# Patient Record
Sex: Female | Born: 1980 | Race: White | Hispanic: No | Smoking: Former smoker
Health system: Southern US, Community
[De-identification: ages and names within clinical notes are randomized; demographics above are authoritative.]

## PROBLEM LIST (undated history)

## (undated) DIAGNOSIS — R51 Headache: Secondary | ICD-10-CM

## (undated) DIAGNOSIS — B019 Varicella without complication: Secondary | ICD-10-CM

## (undated) DIAGNOSIS — T7840XA Allergy, unspecified, initial encounter: Secondary | ICD-10-CM

## (undated) DIAGNOSIS — E119 Type 2 diabetes mellitus without complications: Secondary | ICD-10-CM

## (undated) DIAGNOSIS — R519 Headache, unspecified: Secondary | ICD-10-CM

## (undated) DIAGNOSIS — E1140 Type 2 diabetes mellitus with diabetic neuropathy, unspecified: Secondary | ICD-10-CM

## (undated) DIAGNOSIS — K219 Gastro-esophageal reflux disease without esophagitis: Secondary | ICD-10-CM

## (undated) DIAGNOSIS — F419 Anxiety disorder, unspecified: Secondary | ICD-10-CM

## (undated) DIAGNOSIS — E785 Hyperlipidemia, unspecified: Secondary | ICD-10-CM

## (undated) HISTORY — DX: Headache: R51

## (undated) HISTORY — DX: Hyperlipidemia, unspecified: E78.5

## (undated) HISTORY — DX: Headache, unspecified: R51.9

## (undated) HISTORY — DX: Gastro-esophageal reflux disease without esophagitis: K21.9

## (undated) HISTORY — DX: Varicella without complication: B01.9

## (undated) HISTORY — PX: TONSILLECTOMY: SUR1361

## (undated) HISTORY — DX: Allergy, unspecified, initial encounter: T78.40XA

---

## 2005-07-27 ENCOUNTER — Emergency Department: Payer: Self-pay | Admitting: Emergency Medicine

## 2006-08-09 ENCOUNTER — Other Ambulatory Visit: Payer: Self-pay

## 2006-08-09 ENCOUNTER — Emergency Department: Payer: Self-pay | Admitting: Emergency Medicine

## 2006-10-24 ENCOUNTER — Emergency Department: Payer: Self-pay | Admitting: Emergency Medicine

## 2006-10-27 ENCOUNTER — Ambulatory Visit: Payer: Self-pay | Admitting: Emergency Medicine

## 2006-11-02 ENCOUNTER — Ambulatory Visit: Payer: Self-pay | Admitting: Emergency Medicine

## 2006-11-24 ENCOUNTER — Ambulatory Visit (HOSPITAL_COMMUNITY): Admission: RE | Admit: 2006-11-24 | Discharge: 2006-11-24 | Payer: Self-pay | Admitting: Obstetrics and Gynecology

## 2006-12-23 ENCOUNTER — Ambulatory Visit (HOSPITAL_COMMUNITY): Admission: RE | Admit: 2006-12-23 | Discharge: 2006-12-23 | Payer: Self-pay | Admitting: Family Medicine

## 2006-12-28 ENCOUNTER — Ambulatory Visit: Payer: Self-pay | Admitting: Obstetrics & Gynecology

## 2006-12-29 ENCOUNTER — Ambulatory Visit: Payer: Self-pay | Admitting: Obstetrics & Gynecology

## 2006-12-31 ENCOUNTER — Ambulatory Visit: Payer: Self-pay | Admitting: Obstetrics and Gynecology

## 2007-01-04 ENCOUNTER — Ambulatory Visit: Payer: Self-pay | Admitting: Obstetrics & Gynecology

## 2007-01-04 ENCOUNTER — Ambulatory Visit (HOSPITAL_COMMUNITY): Admission: RE | Admit: 2007-01-04 | Discharge: 2007-01-04 | Payer: Self-pay | Admitting: Family Medicine

## 2007-01-11 ENCOUNTER — Ambulatory Visit: Payer: Self-pay | Admitting: Obstetrics & Gynecology

## 2007-01-20 ENCOUNTER — Ambulatory Visit: Payer: Self-pay | Admitting: Obstetrics and Gynecology

## 2007-01-20 ENCOUNTER — Ambulatory Visit (HOSPITAL_COMMUNITY): Admission: RE | Admit: 2007-01-20 | Discharge: 2007-01-20 | Payer: Self-pay | Admitting: Obstetrics and Gynecology

## 2007-01-25 ENCOUNTER — Ambulatory Visit: Payer: Self-pay | Admitting: Obstetrics & Gynecology

## 2007-01-25 ENCOUNTER — Encounter: Admission: RE | Admit: 2007-01-25 | Discharge: 2007-03-23 | Payer: Self-pay | Admitting: Obstetrics & Gynecology

## 2007-02-01 ENCOUNTER — Ambulatory Visit: Payer: Self-pay | Admitting: Obstetrics & Gynecology

## 2007-02-08 ENCOUNTER — Ambulatory Visit: Payer: Self-pay | Admitting: Obstetrics & Gynecology

## 2007-02-17 ENCOUNTER — Ambulatory Visit (HOSPITAL_COMMUNITY): Admission: RE | Admit: 2007-02-17 | Discharge: 2007-02-17 | Payer: Self-pay | Admitting: Family Medicine

## 2007-02-25 ENCOUNTER — Ambulatory Visit: Payer: Self-pay | Admitting: Family Medicine

## 2007-03-01 ENCOUNTER — Ambulatory Visit: Payer: Self-pay | Admitting: Family Medicine

## 2007-03-17 ENCOUNTER — Ambulatory Visit (HOSPITAL_COMMUNITY): Admission: RE | Admit: 2007-03-17 | Discharge: 2007-03-17 | Payer: Self-pay | Admitting: Family Medicine

## 2007-03-18 ENCOUNTER — Ambulatory Visit: Payer: Self-pay | Admitting: Family Medicine

## 2007-03-25 ENCOUNTER — Ambulatory Visit: Payer: Self-pay | Admitting: Obstetrics & Gynecology

## 2007-04-05 ENCOUNTER — Ambulatory Visit: Payer: Self-pay | Admitting: Obstetrics & Gynecology

## 2007-04-12 ENCOUNTER — Ambulatory Visit: Payer: Self-pay | Admitting: Obstetrics & Gynecology

## 2007-04-14 ENCOUNTER — Ambulatory Visit (HOSPITAL_COMMUNITY): Admission: RE | Admit: 2007-04-14 | Discharge: 2007-04-14 | Payer: Self-pay | Admitting: Family Medicine

## 2007-04-19 ENCOUNTER — Ambulatory Visit: Payer: Self-pay | Admitting: Obstetrics & Gynecology

## 2007-04-26 ENCOUNTER — Encounter: Admission: RE | Admit: 2007-04-26 | Discharge: 2007-04-26 | Payer: Self-pay | Admitting: Obstetrics & Gynecology

## 2007-04-26 ENCOUNTER — Ambulatory Visit: Payer: Self-pay | Admitting: Obstetrics & Gynecology

## 2007-05-03 ENCOUNTER — Ambulatory Visit: Payer: Self-pay | Admitting: Obstetrics & Gynecology

## 2007-05-06 ENCOUNTER — Ambulatory Visit: Payer: Self-pay | Admitting: Obstetrics & Gynecology

## 2007-05-10 ENCOUNTER — Ambulatory Visit: Payer: Self-pay | Admitting: Obstetrics & Gynecology

## 2007-05-13 ENCOUNTER — Ambulatory Visit (HOSPITAL_COMMUNITY): Admission: RE | Admit: 2007-05-13 | Discharge: 2007-05-13 | Payer: Self-pay | Admitting: Family Medicine

## 2007-05-17 ENCOUNTER — Ambulatory Visit: Payer: Self-pay | Admitting: Obstetrics & Gynecology

## 2007-05-19 ENCOUNTER — Ambulatory Visit (HOSPITAL_COMMUNITY): Admission: RE | Admit: 2007-05-19 | Discharge: 2007-05-19 | Payer: Self-pay | Admitting: Family Medicine

## 2007-05-19 ENCOUNTER — Ambulatory Visit: Payer: Self-pay | Admitting: Obstetrics and Gynecology

## 2007-05-24 ENCOUNTER — Ambulatory Visit: Payer: Self-pay | Admitting: Obstetrics & Gynecology

## 2007-05-28 ENCOUNTER — Ambulatory Visit (HOSPITAL_COMMUNITY): Admission: RE | Admit: 2007-05-28 | Discharge: 2007-05-28 | Payer: Self-pay | Admitting: Obstetrics & Gynecology

## 2007-05-31 ENCOUNTER — Ambulatory Visit: Payer: Self-pay | Admitting: Obstetrics & Gynecology

## 2007-06-01 ENCOUNTER — Ambulatory Visit: Payer: Self-pay | Admitting: Obstetrics & Gynecology

## 2007-06-03 ENCOUNTER — Ambulatory Visit: Payer: Self-pay | Admitting: Obstetrics & Gynecology

## 2007-06-03 ENCOUNTER — Encounter: Payer: Self-pay | Admitting: *Deleted

## 2007-06-03 ENCOUNTER — Inpatient Hospital Stay (HOSPITAL_COMMUNITY): Admission: AD | Admit: 2007-06-03 | Discharge: 2007-06-08 | Payer: Self-pay | Admitting: Obstetrics and Gynecology

## 2007-06-05 ENCOUNTER — Encounter: Payer: Self-pay | Admitting: Obstetrics & Gynecology

## 2007-06-11 ENCOUNTER — Ambulatory Visit: Payer: Self-pay | Admitting: Obstetrics & Gynecology

## 2008-12-14 IMAGING — US US OB TRANSVAGINAL MODIFY
1 series · 14 of 28 positions shown · non-contrast
Comparison: none

OBSTETRICAL ULTRASOUND:

 This ultrasound exam was performed in the [HOSPITAL] Ultrasound Department.  The OB US report was generated in the AS system, and faxed to the ordering physician.  This report is also available in [REDACTED] PACS.

[Series 1: us ob transvaginal modify · 0.37mm/px · 14 of 33 slices shown]
[im 2/33]
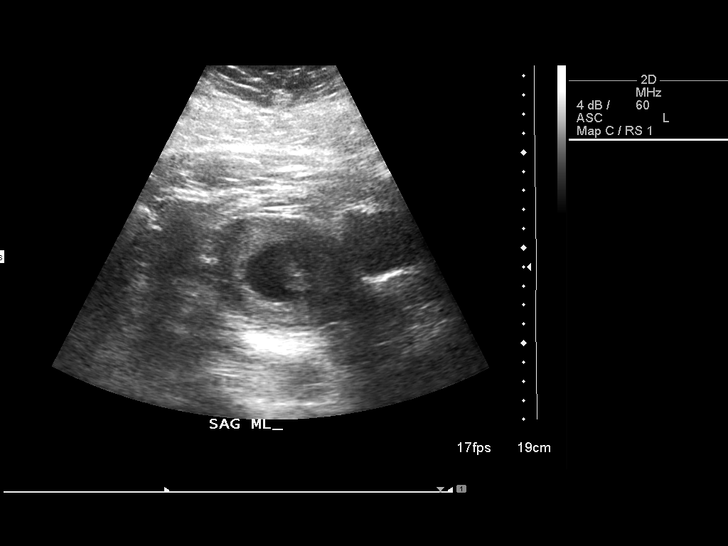
[im 4/33]
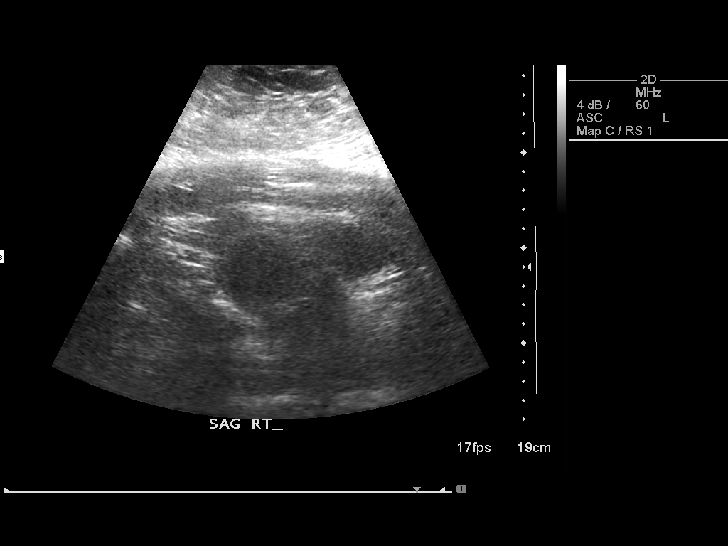
[im 6/33]
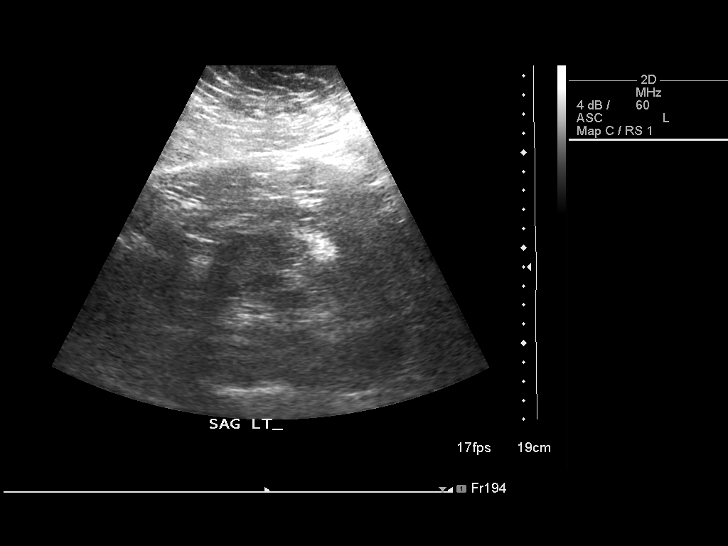
[im 9/33]
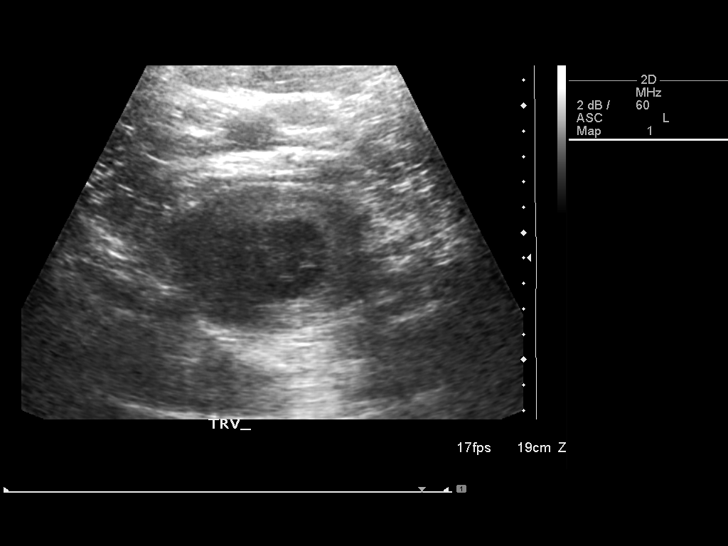
[im 11/33]
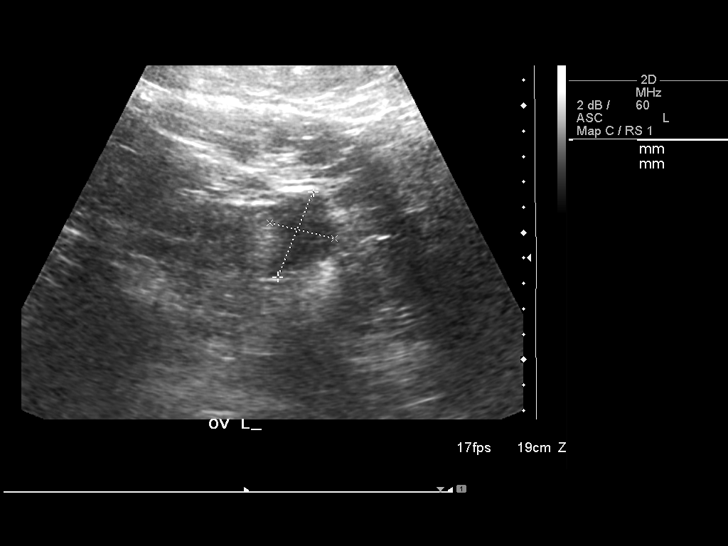
[im 14/33]
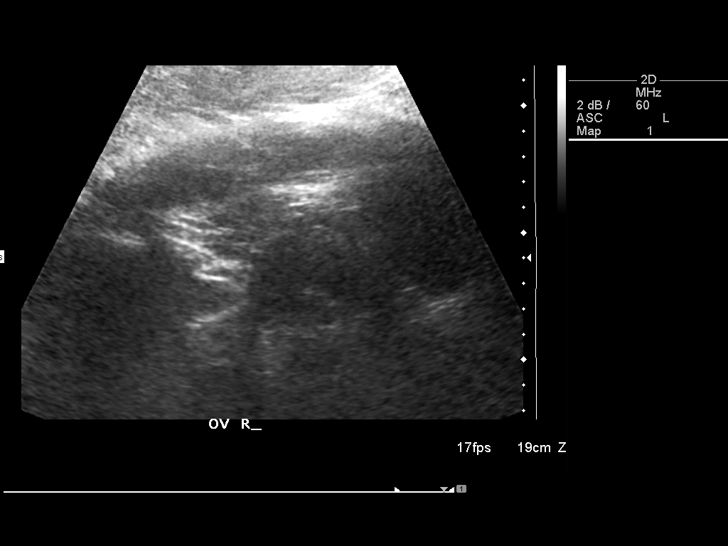
[im 16/33]
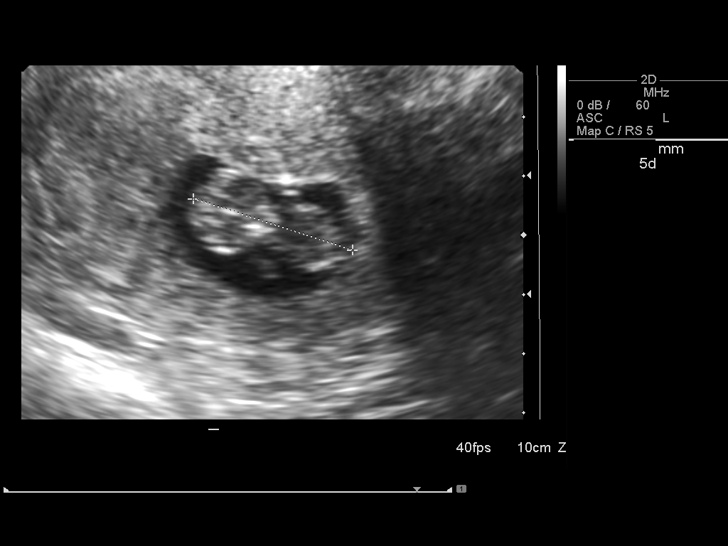
[im 18/33]
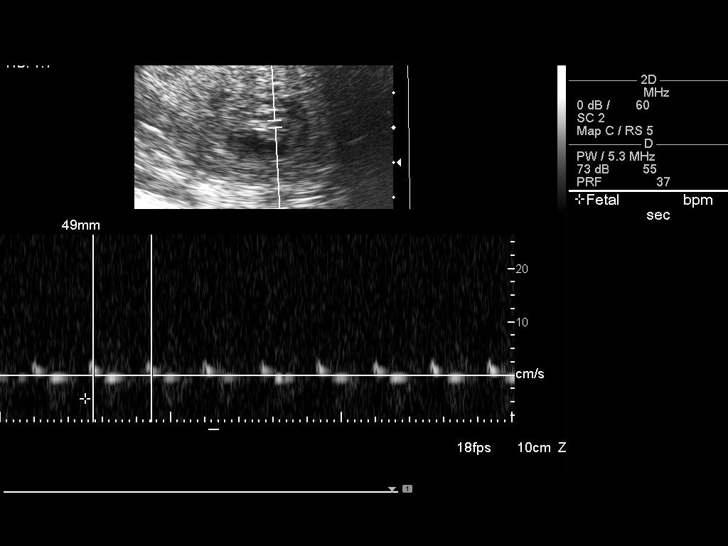
[im 21/33]
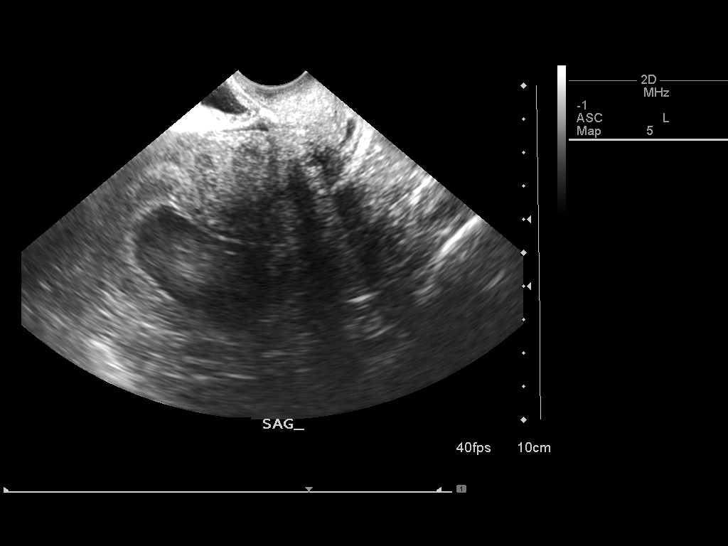
[im 23/33]
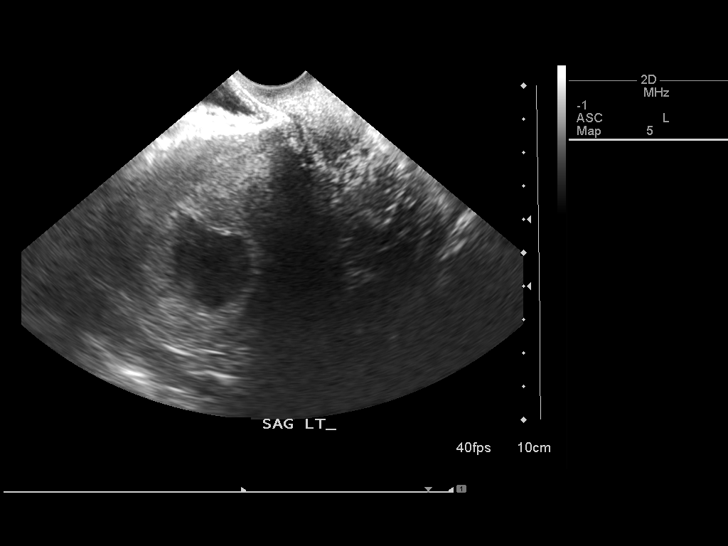
[im 25/33]
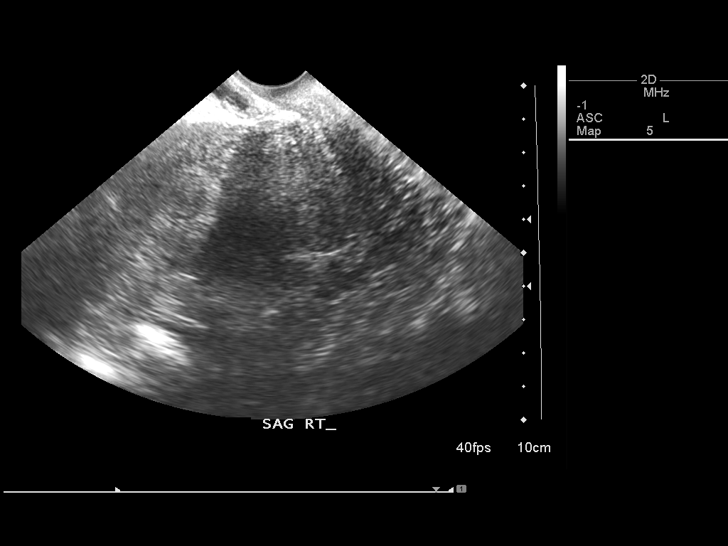
[im 28/33]
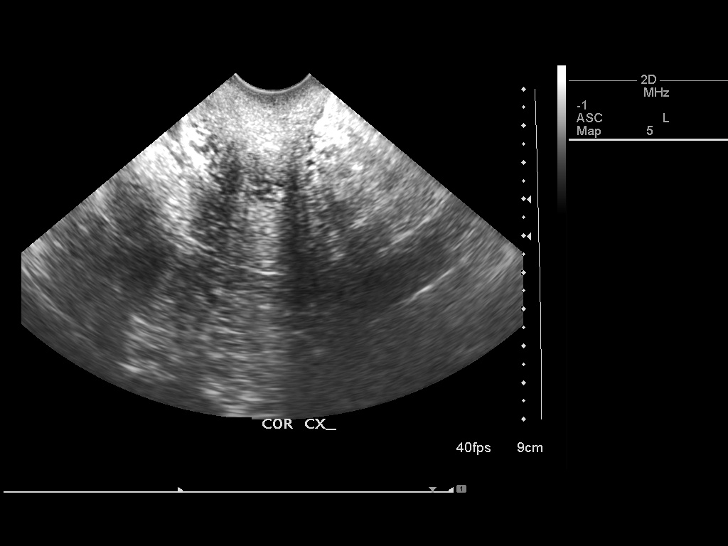
[im 30/33]
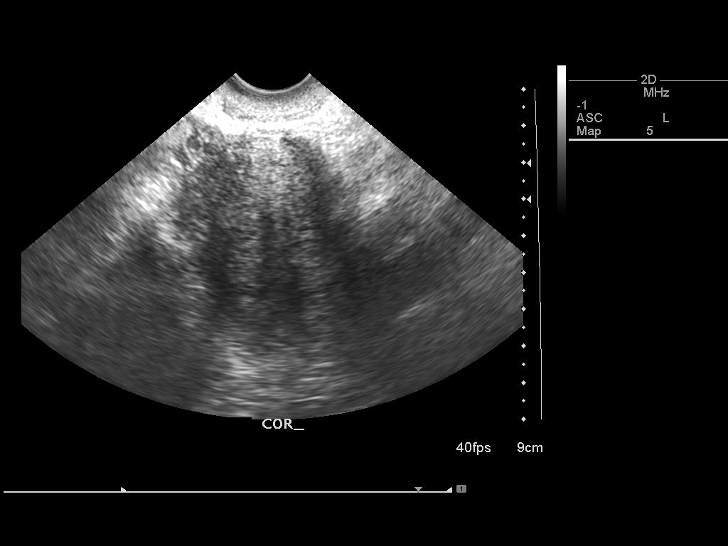
[im 33/33]
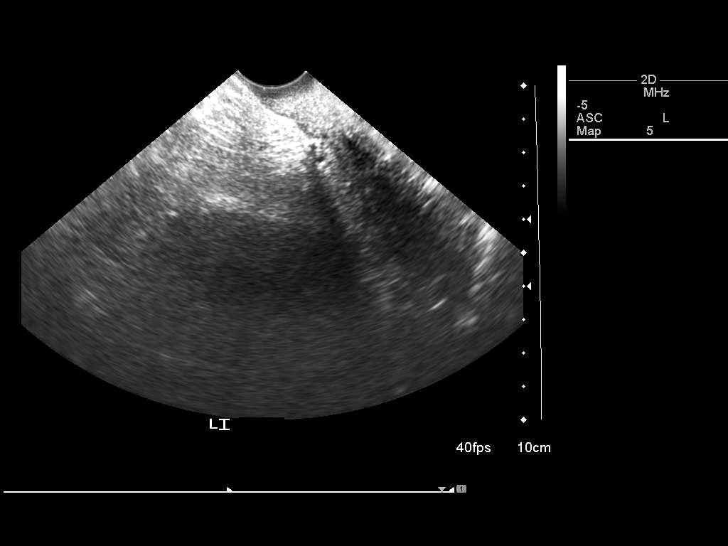

[14 of 28 positions shown; findings below may reference images not displayed]

IMPRESSION: See AS Obstetric US report.

## 2009-01-12 IMAGING — US US OB NUCHAL TRANSLUCENCY 1ST GEST
1 series · 8 of 8 positions shown · non-contrast
Comparison: none

OBSTETRICAL ULTRASOUND:
 This ultrasound was performed in The [HOSPITAL], and the AS OB/GYN report will be stored to [REDACTED] PACS.

[Series 1: us ob nuchal translucency 1st gest · 8 of 8 slices shown]
[im 1/8]
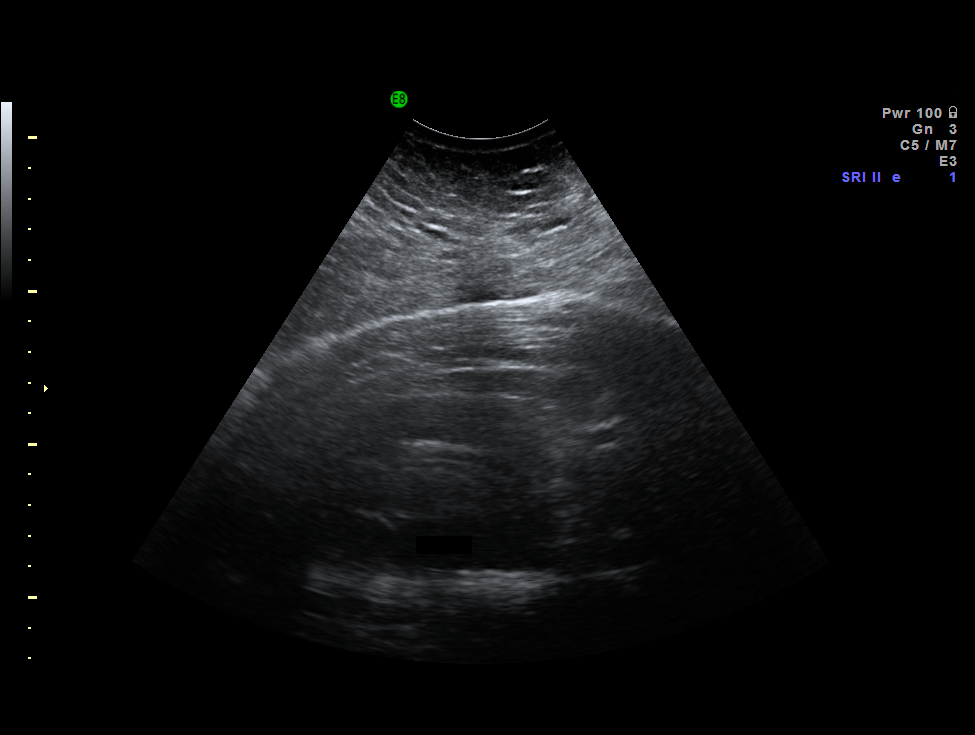
[im 2/8]
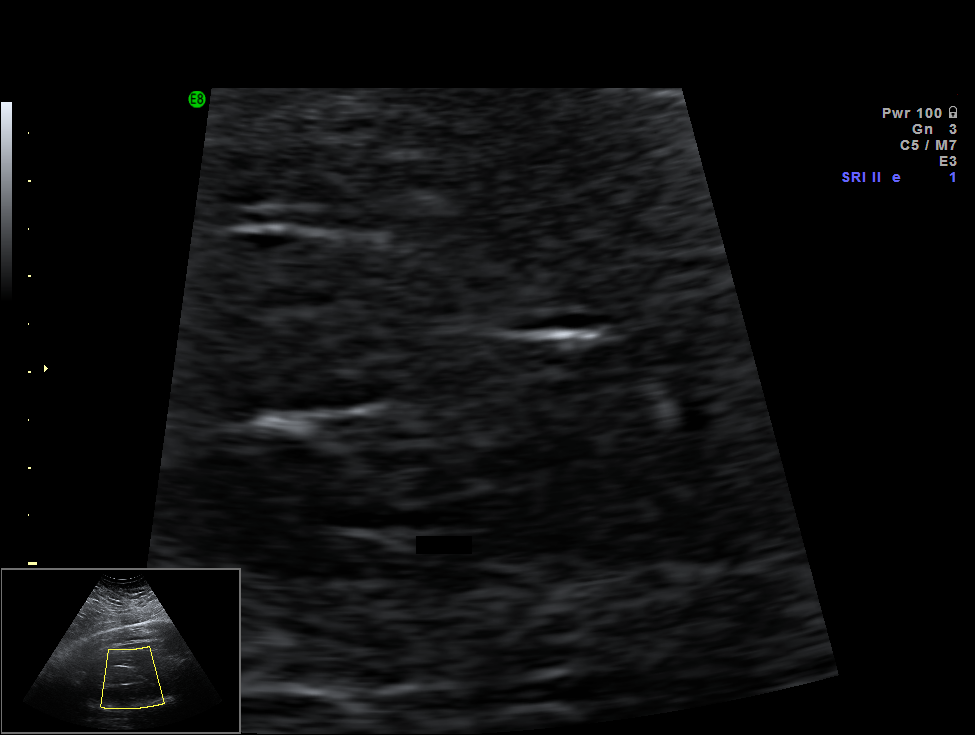
[im 3/8]
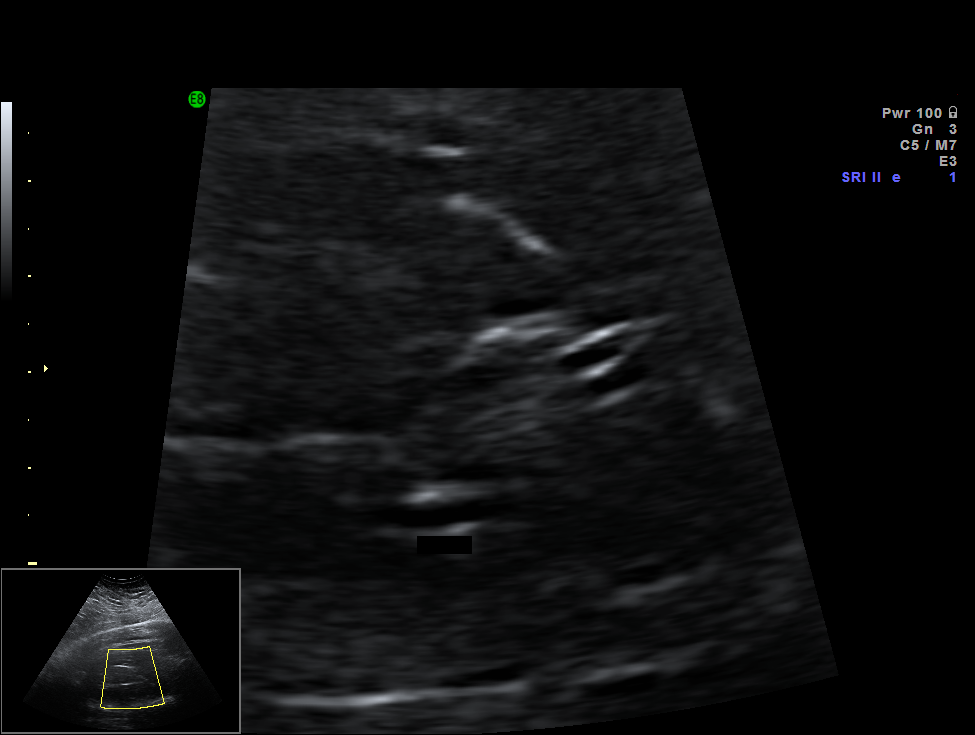
[im 4/8]
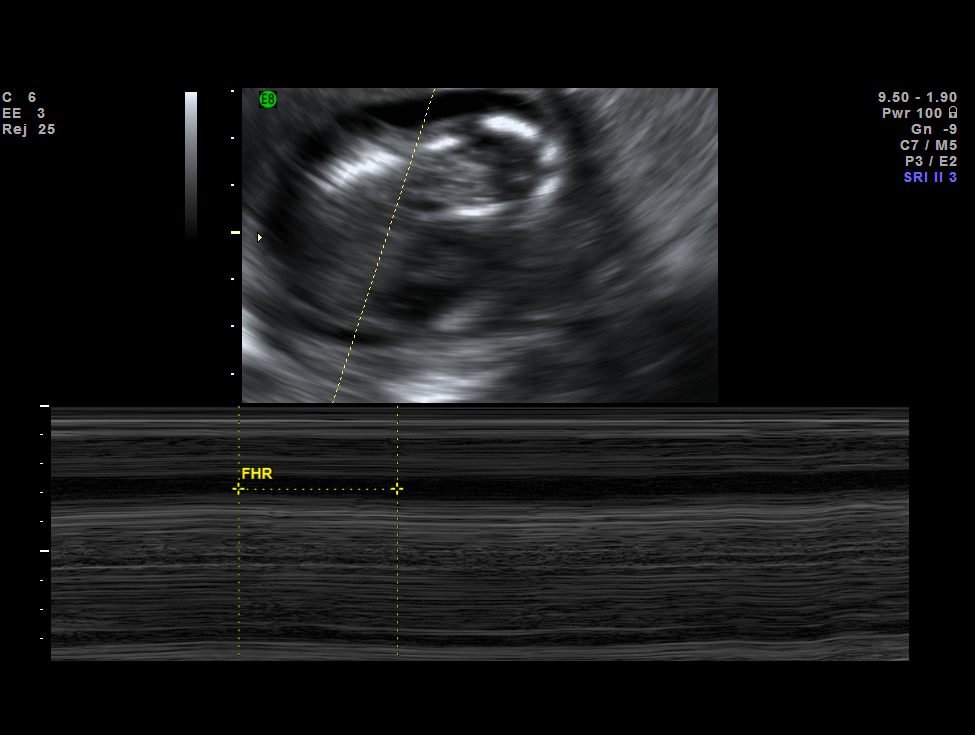
[im 5/8]
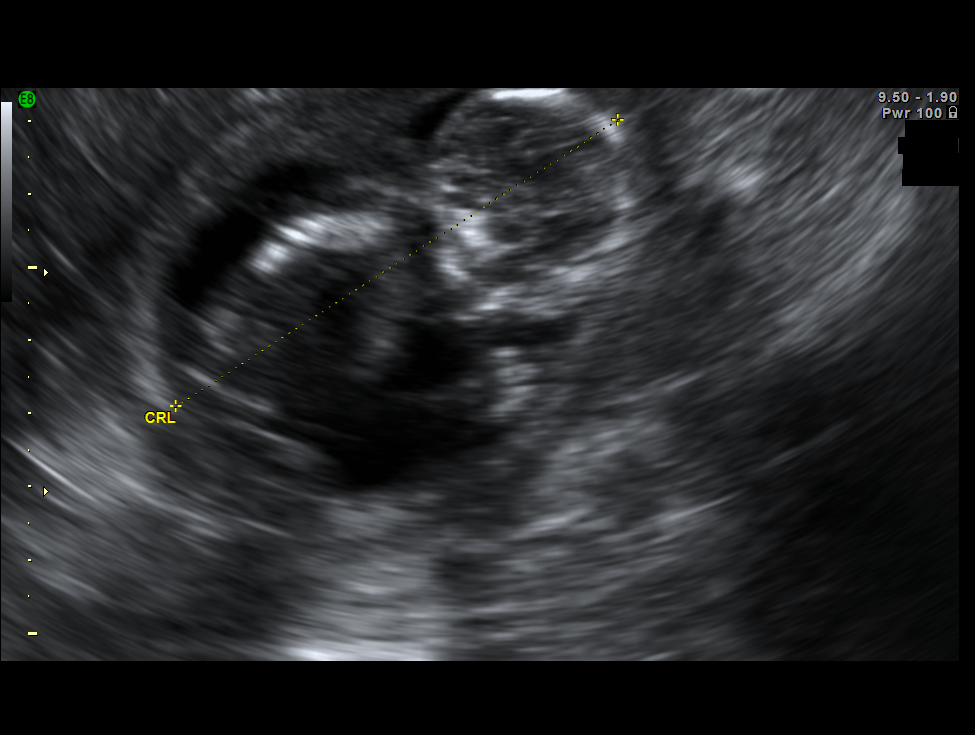
[im 6/8]
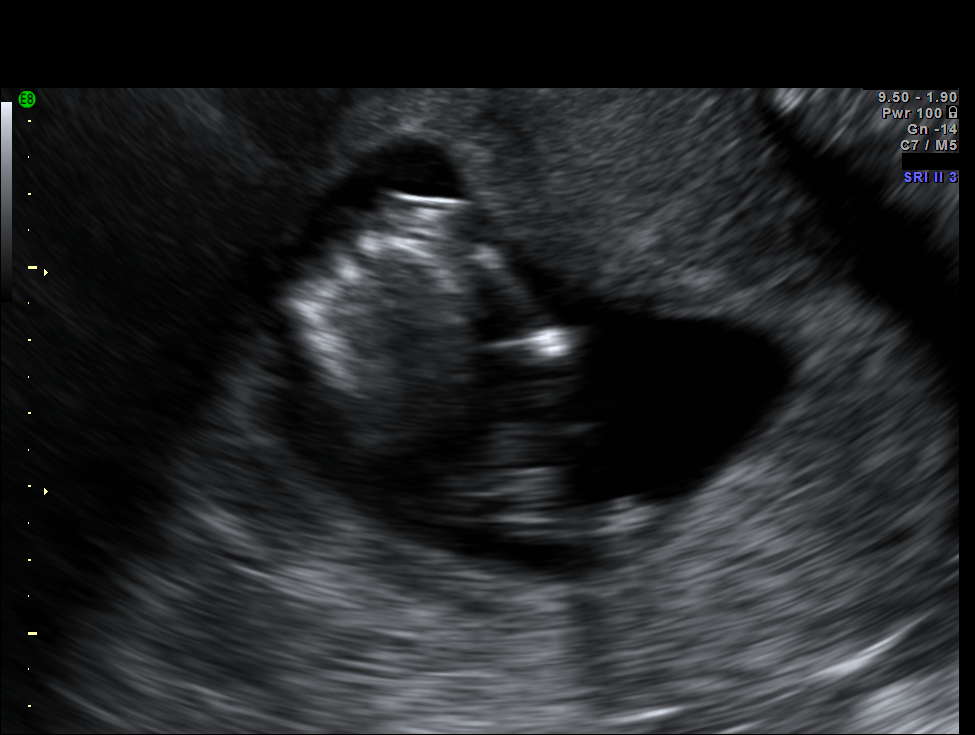
[im 7/8]
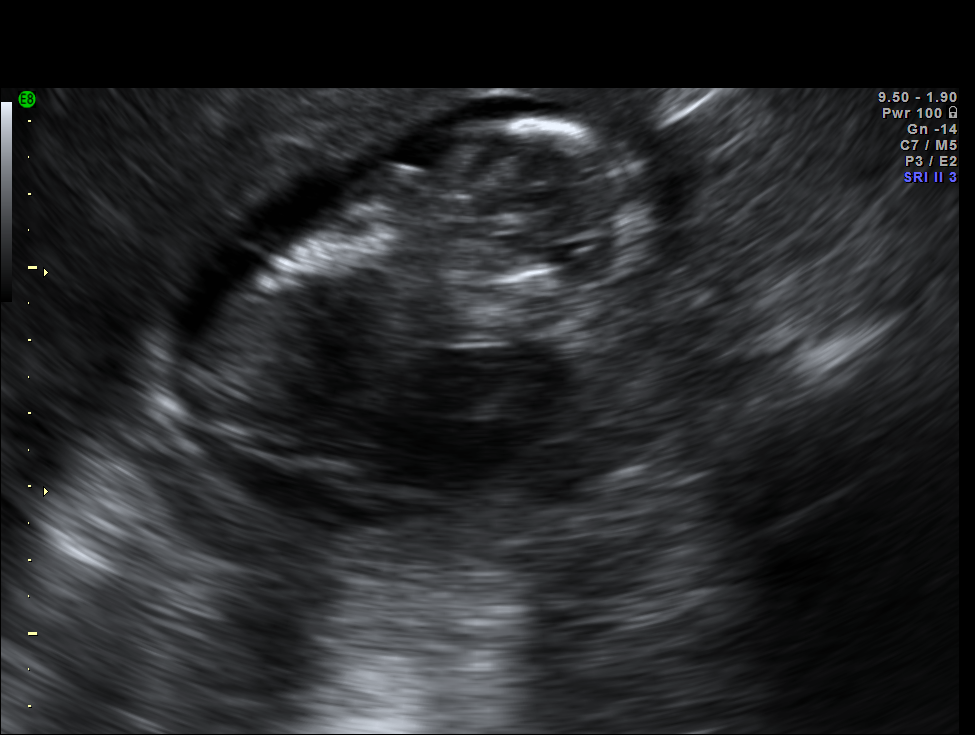
[im 8/8]
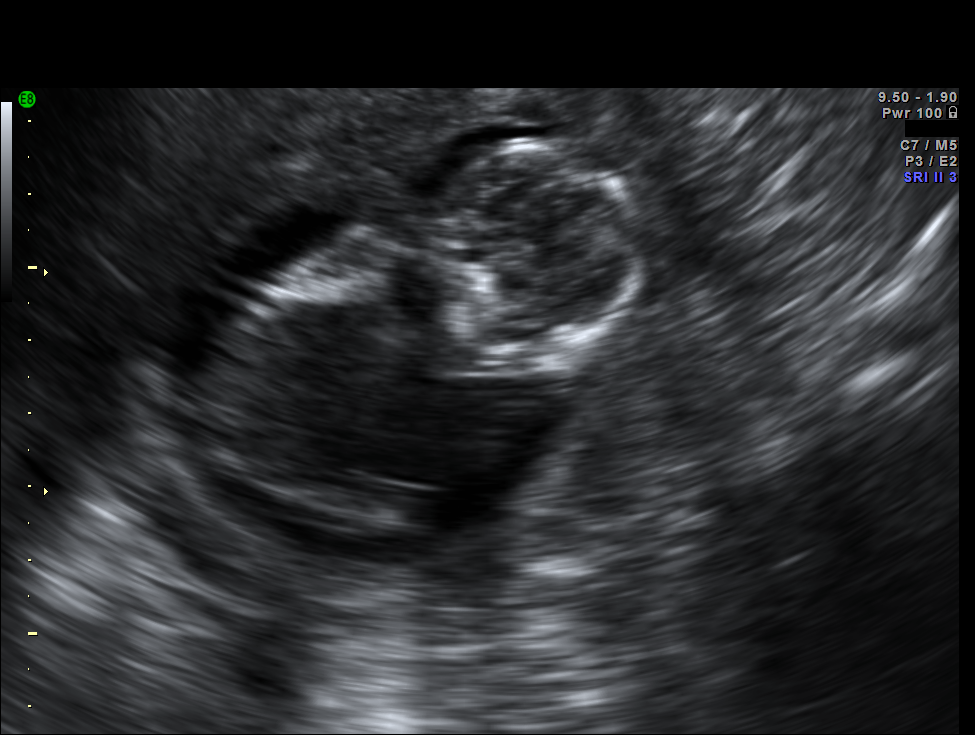

[8 of 8 positions shown; findings below may reference images not displayed]

IMPRESSION: The AS OB/GYN report has also been faxed to the ordering physician.

## 2009-05-04 IMAGING — US US OB FOLLOW-UP
1 series · 14 of 28 positions shown · non-contrast
Comparison: none

OBSTETRICAL ULTRASOUND:
 This ultrasound was performed in The [HOSPITAL], and the AS OB/GYN report will be stored to [REDACTED] PACS.

[Series 1: us ob follow-up · 49 acquisitions, 14 frames shown]
[im 2/49]
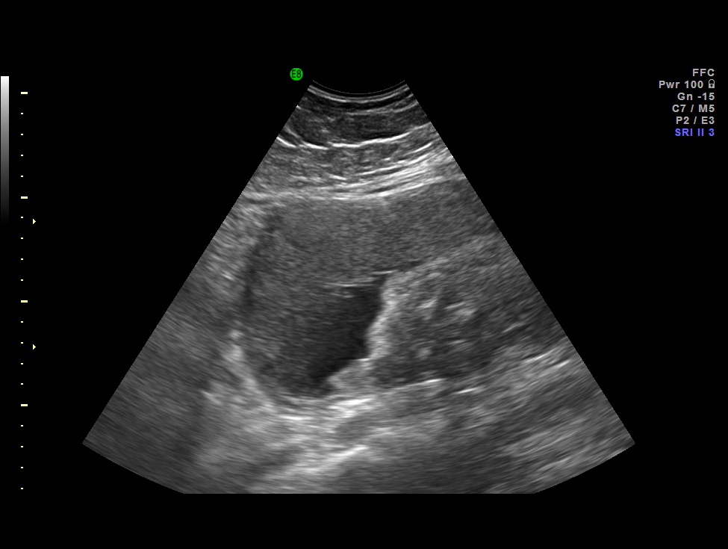
[im 6/49]
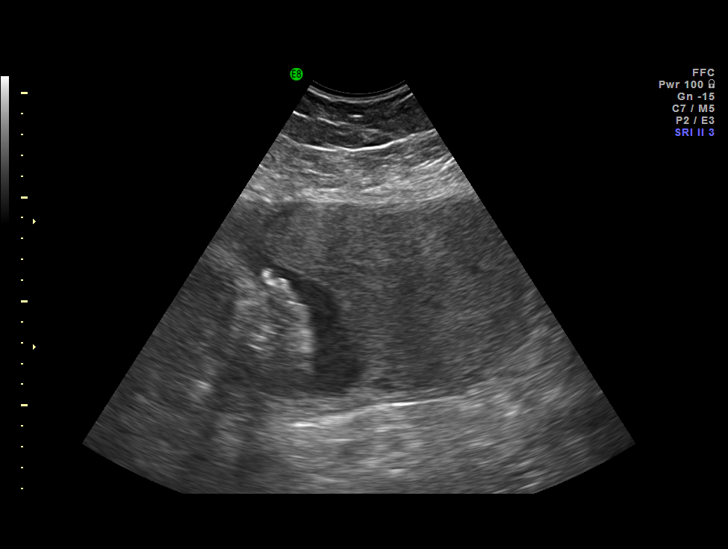
[im 9/49]
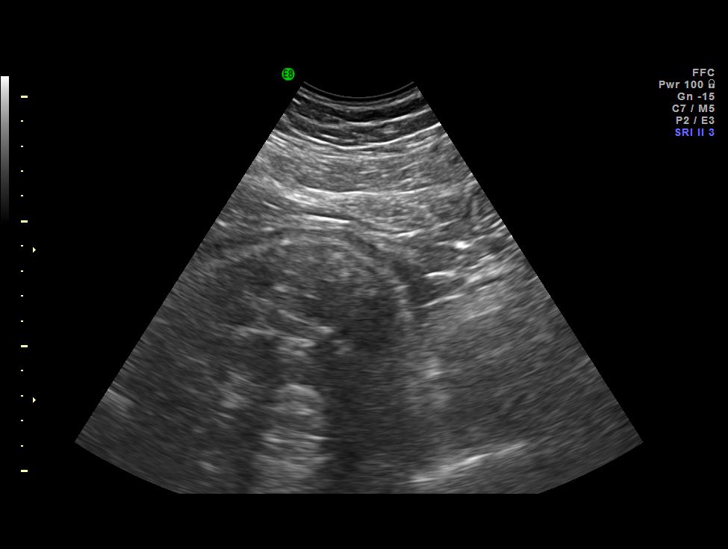
[im 13/49]
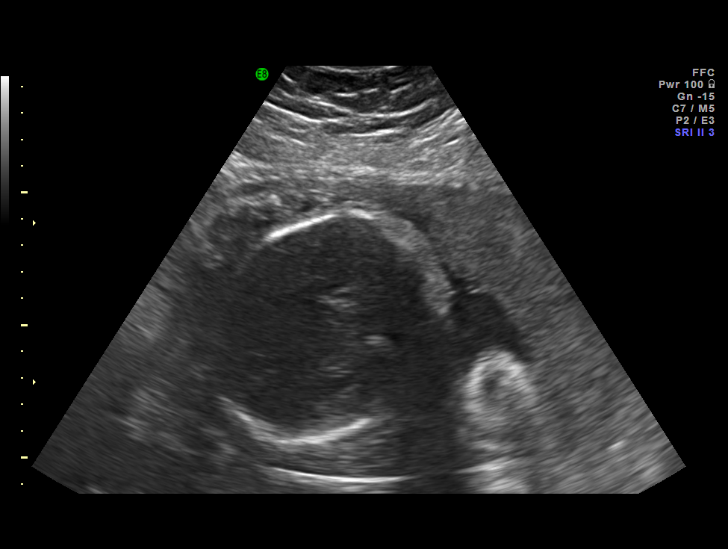
[im 17/49]
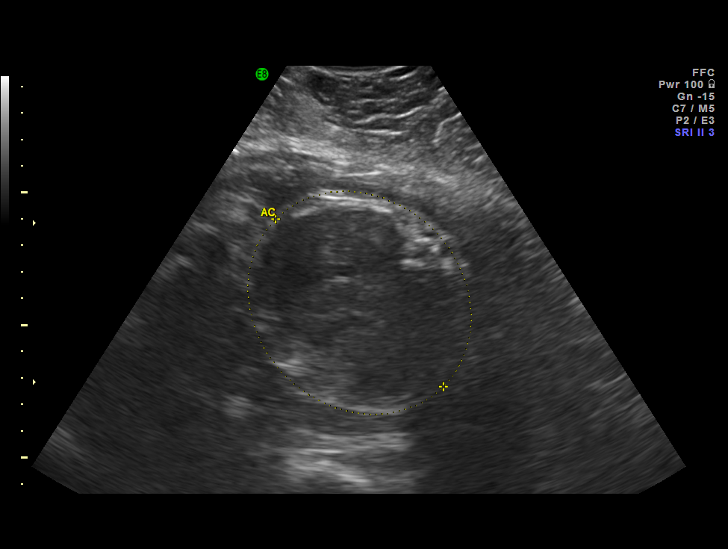
[im 20/49]
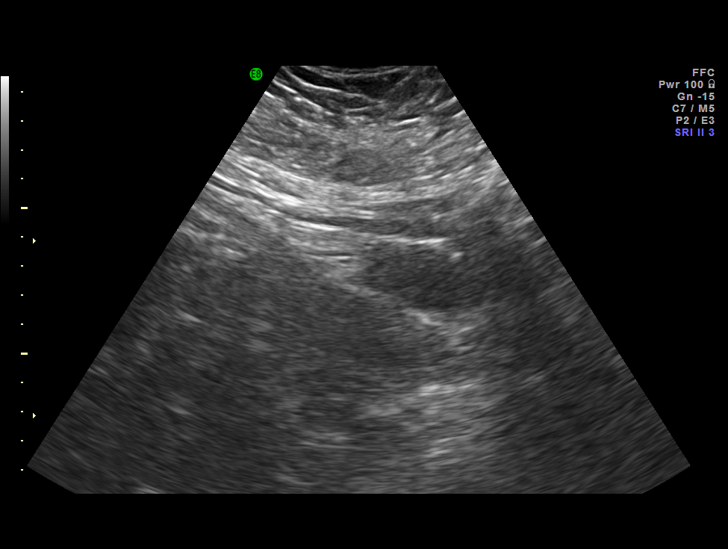
[im 24/49]
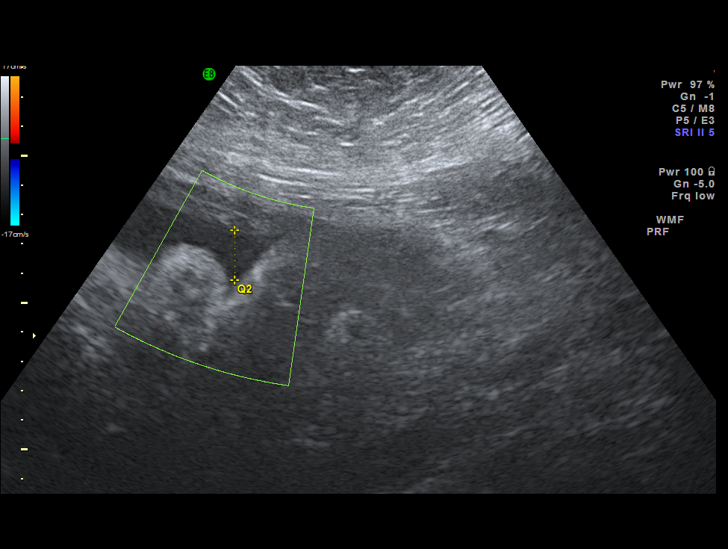
[im 27/49]
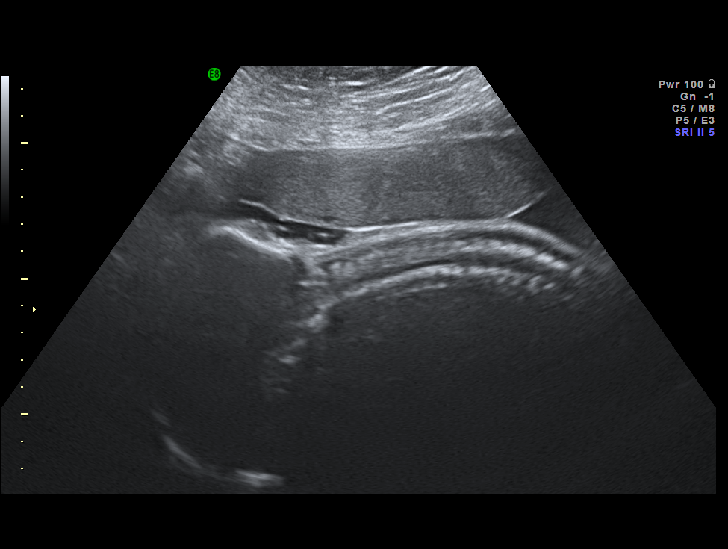
[im 31/49]
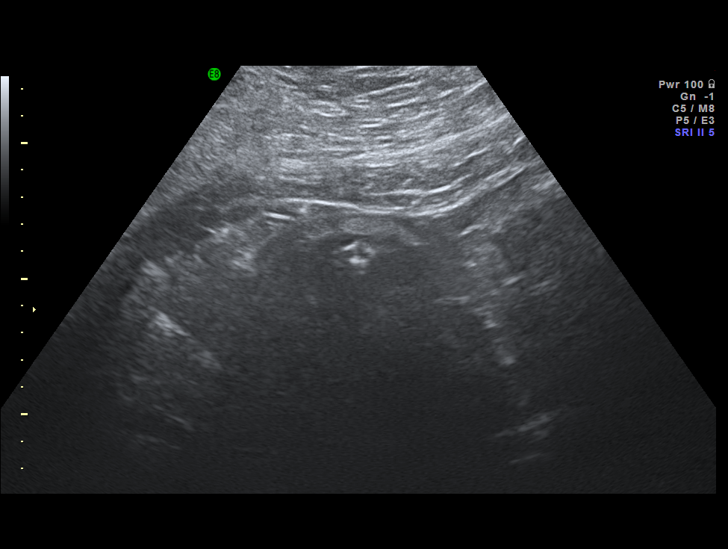
[im 34/49]
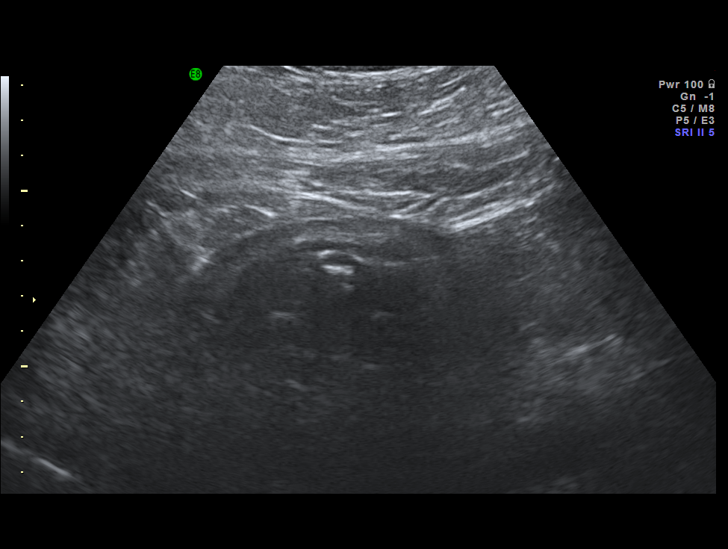
[im 38/49]
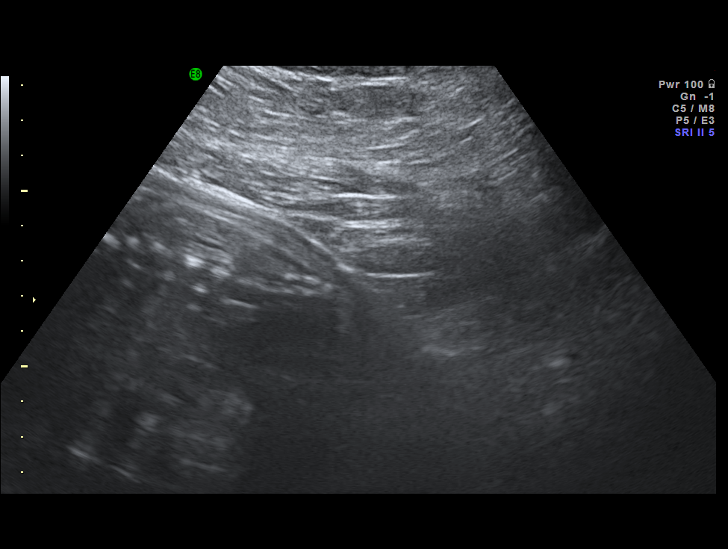
[im 41/49]
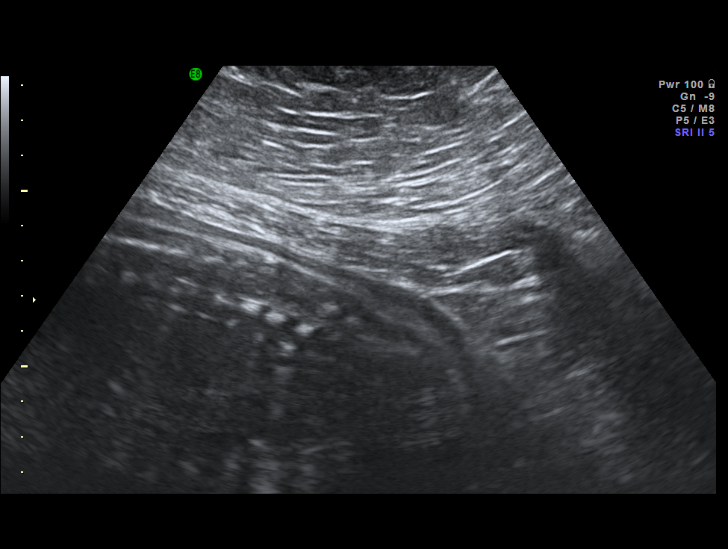
[im 45/49]
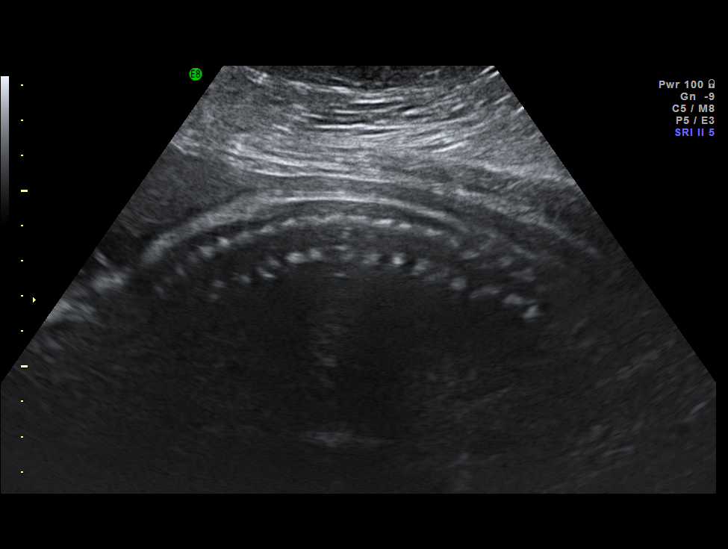
[im 49/49]
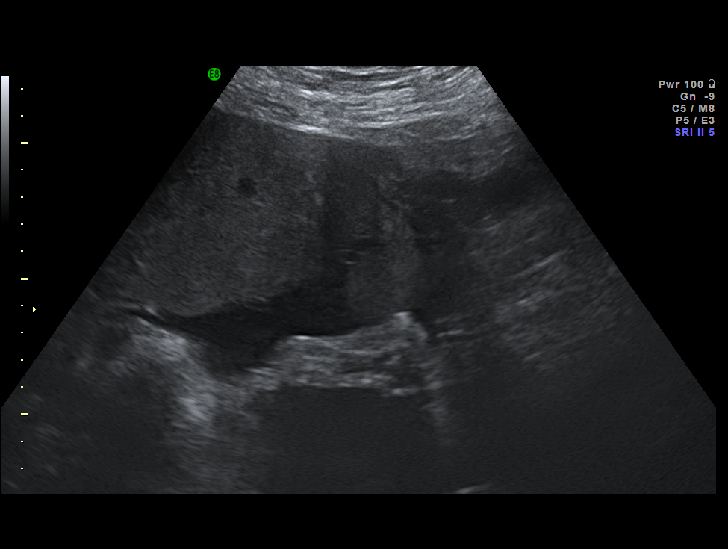

[14 of 28 positions shown; findings below may reference images not displayed]

IMPRESSION: The AS OB/GYN report has also been faxed to the ordering physician.

## 2010-06-17 ENCOUNTER — Emergency Department (HOSPITAL_COMMUNITY)
Admission: EM | Admit: 2010-06-17 | Discharge: 2010-06-17 | Payer: Self-pay | Source: Home / Self Care | Admitting: Emergency Medicine

## 2010-07-14 ENCOUNTER — Encounter: Payer: Self-pay | Admitting: *Deleted

## 2010-07-15 ENCOUNTER — Encounter: Payer: Self-pay | Admitting: *Deleted

## 2010-11-05 NOTE — Op Note (Signed)
NAME:  Deborah Francis, Deborah Francis NO.:  [id number]   MEDICAL RECORD NO.:  [id number]          PATIENT TYPE:  INP   LOCATION:                                FACILITY:  [facility]   PHYSICIAN:  Norton Blizzard, MD    DATE OF BIRTH:  19-Aug-1980   DATE OF PROCEDURE:  06/05/2007  DATE OF DISCHARGE:                               OPERATIVE REPORT   PREOPERATIVE DIAGNOSES:  1. Intrauterine gestation at 37 weeks 1 day.  2. Chronic hypertension, with superimposed mild preeclampsia.  3. Class B gestational diabetes.  4. Morbid obesity.  5. Oblique breech presentation on ultrasound on June 04, 2007.   POSTOPERATIVE DIAGNOSES:  1. Intrauterine gestation at 37 weeks 1 day.  2. Chronic hypertension, with superimposed mild preeclampsia.  3. Class B gestational diabetes.  4. Morbid obesity.  5. Oblique breech presentation on ultrasound on June 04, 2007.   PROCEDURE:  Primary low transverse cesarean section.   SURGEON:  Norton Blizzard, M.D.   ASSISTANT:  Karlton Lemon, M.D.   ANESTHESIA:  Spinal.   FINDINGS:  1. Viable infant, female, weighing 7 pounds 1 ounce, with Apgars 9 at      one minute and 9 at five minutes.  Frank breech presentation.  Cord      pH of 7.06.  2. Clear amniotic fluid.  3. Normal female pelvic anatomy, with decidual changes of pregnancy.   ESTIMATED BLOOD LOSS:  800 mL.   DRAINS:  Foley, with 200 mL of clear yellow urine.   COMPLICATIONS:  None immediate.   SPECIMENS:  Placenta to pathology, cord pH to the lab.   INDICATIONS FOR PROCEDURE:  This is a 30 year old gravida 1 para 0 at 66  and 1 weeks, admitted with elevated blood pressures and history of  chronic hypertension.  She was found to have 1300 mg of protein in her  urine, giving her a diagnosis of chronic hypertension with superimposed  mild preeclampsia.  She also has a diagnosis of gestational diabetes,  for which she takes insulin.  She was ultrasounded on June 04, 2007  and found to  be in an oblique breech position.  The patient was offered  external version of the infant, which she declined.  At that point,  risks and complications of cesarean section were discussed with her.  Due to her multiple complications at term, it was determined that she  would go forward with a cesarean section.   DESCRIPTION OF PROCEDURE:  The patient was taken to the operating room  and, after obtaining adequate spinal anesthesia, was prepped and draped  in the usual sterile manner in the supine position, with left lateral  uterine displacement.  After ensuring adequate anesthesia, a  Pfannenstiel skin incision was made using the scalpel.  The incision was  carried through the subcutaneous tissues using a scalpel.  Areas of  bleeding in the subcutaneous tissue were controlled with Bovie cautery.  The rectus fascia was nicked in the midline, and the incision was  extended laterally in each direction using the Mayo scissors.  The  rectus muscles were dissected free of the fascia using both sharp and  blunt dissection.  The rectus muscles were then separated bluntly.  The  parietal peritoneum was identified and opened bluntly.  Areas of the  parietal peritoneum were further separated with Bovie cautery.  At this  time, an Alexis wound retractor was placed.  Bladder blade was then  placed.  A reflection of the visceral peritoneum superior to the bladder  was identified and elevated and incised using the Metzenbaum scissors  and the incision extended laterally.  The bladder flap was created using  blunt dissection and retracted with a bladder blade.  A low transverse  uterine incision was made using the scalpel, and the incision was  extended laterally and superiorly using bandage scissors.  A hand was  placed within the uterine cavity, and the infant was found to be in  frank breech position.  The Alexis wound retractor was then removed, and  the infant's body was delivered in a frank breech  position, with minimal  difficulty.  The arms were then swept out of the uterine cavity, and the  head was delivered atraumatically.  The infant was bulb suctioned after  delivery of the head.  Cord was clamped and incised, and the infant was  handed to the delivery team in attendance.  Specimen was collected for  cord pH.  Placenta was attempted to be delivered with cord traction, at  which time the cord avulsed.  The placenta was then delivered manually  and appeared intact.  The uterus was elevated onto the anterior  abdominal wall and wrapped in a wet laparotomy sponge.  The endometrial  cavity was wiped free of any trace of the membranes using wet laparotomy  sponges.  The edges of the uterine incision were grasped with ring  clamps, and the uterine incision was closed in one layer of running-  locking stitch of 0 Monocryl.  An area of bleeding was controlled with  one figure-of-eight stitch with 0 Monocryl.  The incision was inspected  and found to be hemostatic.  The colic gutters were wiped free of any  traces of blood.  The uterus was then placed back within the abdominal  cavity, and the uterine incision was inspected again and found to be  hemostatic.  The rectus muscle was reapproximated with one stitch of  plain gut suture.  The rectus fascia was reapproximated with two sutures  of 0 PDS, beginning laterally and meeting in the midline.  Small areas  of bleeding in the subcutaneous tissue were controlled using the Bovie  cautery.  The subcutaneous tissue was closed in two layers, beginning  with two interrupted stitches of plain gut in the deep subcutaneous  tissue, and three stitches of interrupted plain gut suture in a more  superficial layer of the subcutaneous tissue.  The skin was  reapproximated with stainless steel skin staples.  Sponge, needle, and  instrument counts were correct x2.  The patient tolerated the procedure  well and went to the postanesthesia care unit in  stable condition.      Karlton Lemon, MD      Norton Blizzard, MD  Electronically Signed    NS/MEDQ  D:  06/05/2007  T:  06/07/2007  Job:  [id number]

## 2010-11-05 NOTE — Discharge Summary (Signed)
NAME:  Deborah Francis, Deborah Francis NO.:  [id number]   MEDICAL RECORD NO.:  [id number]          PATIENT TYPE:  INP   LOCATION:  9126                          FACILITY:  [facility]   PHYSICIAN:  Allie Bossier, MD        DATE OF BIRTH:  01-Jan-1981   DATE OF ADMISSION:  06/03/2007  DATE OF DISCHARGE:  06/08/2007                               DISCHARGE SUMMARY   REASON FOR ADMISSION:  The patient was admitted for observation  secondary to elevated blood pressures on superimposed chronic  hypertension and a 36-6/7 weeks' gestational age pregnancy in a G1, P0  mom. The patient also has known class B gestational diabetes mellitus.  The patient was observed for development of preeclampsia.   HOSPITAL COURSE:  A 24-hour urine on the patient taken on December 9 did  show 1320 mg of protein. PIH labs on admission were within normal  limits. The patient was observed until her pregnancy was at term due to  her elevated blood pressures and mild preeclampsia as well as her  diabetes mellitus. A decision was made to delivery. The baby, however,  was breech on a June 04, 2007 ultrasound. The patient was offered  external version but declined this and so was consented for a cesarean  section. A cesarean section was performed on June 05, 2007 by Dr.  Silas Flood and Dr. Sharol Harness. Please see their operative note for details of an  uncomplicated cesarean section. Following the cesarean section, the  patient was kept on magnesium for 24 hours. Postpartum, she did well and  had no complications. Staples will be removed by the [facility] nurse at  home between postoperative day 7 and 10.   CONDITION AT TIME OF DISCHARGE:  Stable.   DISPOSITION:  Home.      Ardeen Garland, MD      Allie Bossier, MD  Electronically Signed    LM/MEDQ  D:  06/08/2007  T:  06/08/2007  Job:  [id number]

## 2011-03-31 LAB — CBC
HCT: 34.4 — ABNORMAL LOW
HCT: 36.8
HCT: 37.9
Hemoglobin: 12.5
Hemoglobin: 12.5
MCHC: 33.4
MCHC: 33.6
MCV: 82.4
MCV: 82.4
MCV: 82.9
MCV: 82.9
Platelets: 134 — ABNORMAL LOW
Platelets: 148 — ABNORMAL LOW
RBC: 4.48
RBC: 4.6
RBC: 4.61
RBC: 4.65
RDW: 18.1 — ABNORMAL HIGH
RDW: 18.1 — ABNORMAL HIGH
RDW: 18.4 — ABNORMAL HIGH
WBC: 10.5
WBC: 9.4

## 2011-03-31 LAB — BUN: BUN: 12

## 2011-03-31 LAB — URIC ACID: Uric Acid, Serum: 6

## 2011-03-31 LAB — POCT URINALYSIS DIP (DEVICE)
Bilirubin Urine: NEGATIVE
Hgb urine dipstick: NEGATIVE
Ketones, ur: NEGATIVE
Ketones, ur: NEGATIVE
Nitrite: NEGATIVE
Operator id: 194561
Protein, ur: 100 — AB
Specific Gravity, Urine: 1.025
Urobilinogen, UA: 0.2
pH: 7

## 2011-03-31 LAB — COMPREHENSIVE METABOLIC PANEL
AST: 14
CO2: 26
Calcium: 9.2
Creatinine, Ser: 0.78
GFR calc Af Amer: >60
GFR calc non Af Amer: >60
Sodium: 138
Total Protein: 6.2

## 2011-03-31 LAB — CREATININE CLEARANCE, URINE, 24 HOUR
Creatinine, 24H Ur: 2107 — ABNORMAL HIGH
Creatinine, Urine: 110.9
Creatinine: 0.78
Urine Total Volume-CRCL: 1900

## 2011-03-31 LAB — URINALYSIS, ROUTINE W REFLEX MICROSCOPIC
Leukocytes, UA: NEGATIVE
Nitrite: NEGATIVE
Protein, ur: 100 — AB
Specific Gravity, Urine: 1.025
Urobilinogen, UA: 0.2

## 2011-03-31 LAB — CREATININE, SERUM
Creatinine, Ser: 0.74
GFR calc non Af Amer: >60

## 2011-03-31 LAB — URINE MICROSCOPIC-ADD ON

## 2011-03-31 LAB — RPR: RPR Ser Ql: NONREACTIVE

## 2011-03-31 LAB — ALT: ALT: 14

## 2011-03-31 LAB — AST: AST: 14

## 2011-04-01 LAB — POCT URINALYSIS DIP (DEVICE)
Ketones, ur: NEGATIVE
Ketones, ur: NEGATIVE
Ketones, ur: NEGATIVE
Nitrite: NEGATIVE
Nitrite: NEGATIVE
Protein, ur: 30 — AB
Protein, ur: NEGATIVE
Protein, ur: NEGATIVE
Protein, ur: NEGATIVE
Urobilinogen, UA: 0.2
pH: 6.5
pH: 7
pH: 7
pH: 7

## 2011-04-02 LAB — POCT URINALYSIS DIP (DEVICE)
Hgb urine dipstick: NEGATIVE
Hgb urine dipstick: NEGATIVE
Ketones, ur: NEGATIVE
Ketones, ur: NEGATIVE
Protein, ur: NEGATIVE
Protein, ur: NEGATIVE
Specific Gravity, Urine: 1.01
Specific Gravity, Urine: 1.025
pH: 6.5
pH: 7

## 2011-04-03 LAB — POCT URINALYSIS DIP (DEVICE)
Bilirubin Urine: NEGATIVE
Bilirubin Urine: NEGATIVE
Ketones, ur: NEGATIVE
Ketones, ur: NEGATIVE
Ketones, ur: NEGATIVE
Operator id: 120861
Protein, ur: NEGATIVE
Protein, ur: NEGATIVE
Protein, ur: NEGATIVE
Specific Gravity, Urine: 1.025
Specific Gravity, Urine: 1.025
Specific Gravity, Urine: 1.025
pH: 6.5
pH: 7
pH: 6.5

## 2011-04-04 ENCOUNTER — Ambulatory Visit: Payer: Self-pay | Admitting: Family Medicine

## 2011-04-04 LAB — POCT URINALYSIS DIP (DEVICE)
Ketones, ur: 15 — AB
Operator id: 135281
Protein, ur: 30 — AB
Specific Gravity, Urine: 1.025

## 2011-04-07 LAB — POCT URINALYSIS DIP (DEVICE)
Bilirubin Urine: NEGATIVE
Bilirubin Urine: NEGATIVE
Glucose, UA: NEGATIVE
Glucose, UA: NEGATIVE
Ketones, ur: 40 — AB
Ketones, ur: NEGATIVE
Operator id: 148111
Operator id: 159681
Protein, ur: 30 — AB
Specific Gravity, Urine: 1.025
Specific Gravity, Urine: 1.025
Specific Gravity, Urine: 1.01

## 2011-04-08 LAB — POCT URINALYSIS DIP (DEVICE)
Bilirubin Urine: NEGATIVE
Glucose, UA: NEGATIVE
Glucose, UA: NEGATIVE
Ketones, ur: NEGATIVE
Nitrite: NEGATIVE
Operator id: 135281
Operator id: 159681
Specific Gravity, Urine: 1.01
Specific Gravity, Urine: >=1.03
Urobilinogen, UA: 0.2
Urobilinogen, UA: 0.2

## 2012-10-17 ENCOUNTER — Emergency Department: Payer: Self-pay | Admitting: Emergency Medicine

## 2012-10-17 LAB — CBC
HGB: 12.9 g/dL (ref 12.0–16.0)
MCH: 25.2 pg — ABNORMAL LOW (ref 26.0–34.0)
Platelet: 234 10*3/uL (ref 150–440)
WBC: 10.7 10*3/uL (ref 3.6–11.0)

## 2012-10-17 LAB — LIPASE, BLOOD: Lipase: 66 U/L — ABNORMAL LOW (ref 73–393)

## 2012-10-17 LAB — COMPREHENSIVE METABOLIC PANEL
Albumin: 3.5 g/dL (ref 3.4–5.0)
Calcium, Total: 8.8 mg/dL (ref 8.5–10.1)
Co2: 25 mmol/L (ref 21–32)
Creatinine: 0.77 mg/dL (ref 0.60–1.30)
Osmolality: 284 (ref 275–301)
SGPT (ALT): 18 U/L (ref 12–78)
Sodium: 135 mmol/L — ABNORMAL LOW (ref 136–145)

## 2012-10-17 LAB — URINALYSIS, COMPLETE
Bilirubin,UR: NEGATIVE
Blood: NEGATIVE
Glucose,UR: >=500 mg/dL (ref 0–75)
Ketone: NEGATIVE
Protein: NEGATIVE
WBC UR: <1 /HPF (ref 0–5)

## 2012-10-18 LAB — WET PREP, GENITAL

## 2014-02-26 ENCOUNTER — Encounter (HOSPITAL_COMMUNITY): Payer: Self-pay | Admitting: Emergency Medicine

## 2014-02-26 ENCOUNTER — Emergency Department (HOSPITAL_COMMUNITY): Payer: BC Managed Care – PPO

## 2014-02-26 ENCOUNTER — Emergency Department (HOSPITAL_COMMUNITY)
Admission: EM | Admit: 2014-02-26 | Discharge: 2014-02-26 | Disposition: A | Discharge status: Home / Self Care | Payer: BC Managed Care – PPO | Attending: Emergency Medicine | Admitting: Emergency Medicine

## 2014-02-26 DIAGNOSIS — O99210 Obesity complicating pregnancy, unspecified trimester: Secondary | ICD-10-CM | POA: Diagnosis not present

## 2014-02-26 DIAGNOSIS — E669 Obesity, unspecified: Secondary | ICD-10-CM | POA: Insufficient documentation

## 2014-02-26 DIAGNOSIS — O9989 Other specified diseases and conditions complicating pregnancy, childbirth and the puerperium: Secondary | ICD-10-CM | POA: Diagnosis present

## 2014-02-26 DIAGNOSIS — Z79899 Other long term (current) drug therapy: Secondary | ICD-10-CM | POA: Insufficient documentation

## 2014-02-26 DIAGNOSIS — R1032 Left lower quadrant pain: Secondary | ICD-10-CM | POA: Diagnosis not present

## 2014-02-26 DIAGNOSIS — Z9889 Other specified postprocedural states: Secondary | ICD-10-CM | POA: Diagnosis not present

## 2014-02-26 DIAGNOSIS — O24919 Unspecified diabetes mellitus in pregnancy, unspecified trimester: Secondary | ICD-10-CM | POA: Insufficient documentation

## 2014-02-26 DIAGNOSIS — Z791 Long term (current) use of non-steroidal anti-inflammatories (NSAID): Secondary | ICD-10-CM | POA: Insufficient documentation

## 2014-02-26 DIAGNOSIS — O039 Complete or unspecified spontaneous abortion without complication: Secondary | ICD-10-CM | POA: Insufficient documentation

## 2014-02-26 HISTORY — DX: Type 2 diabetes mellitus without complications: E11.9

## 2014-02-26 LAB — CBC
HEMATOCRIT: 35.4 % — AB (ref 36.0–46.0)
Hemoglobin: 11.2 g/dL — ABNORMAL LOW (ref 12.0–15.0)
MCH: 25.1 pg — ABNORMAL LOW (ref 26.0–34.0)
MCHC: 31.6 g/dL (ref 30.0–36.0)
MCV: 79.4 fL (ref 78.0–100.0)
Platelets: 221 10*3/uL (ref 150–400)
RBC: 4.46 MIL/uL (ref 3.87–5.11)
RDW: 14.3 % (ref 11.5–15.5)
WBC: 7.7 10*3/uL (ref 4.0–10.5)

## 2014-02-26 LAB — BASIC METABOLIC PANEL
Anion gap: 12 (ref 5–15)
BUN: 9 mg/dL (ref 6–23)
CALCIUM: 8.5 mg/dL (ref 8.4–10.5)
CO2: 25 mEq/L (ref 19–32)
CREATININE: 0.74 mg/dL (ref 0.50–1.10)
Chloride: 102 mEq/L (ref 96–112)
Glucose, Bld: 195 mg/dL — ABNORMAL HIGH (ref 70–99)
POTASSIUM: 4.5 meq/L (ref 3.7–5.3)
Sodium: 139 mEq/L (ref 137–147)

## 2014-02-26 LAB — CBG MONITORING, ED: GLUCOSE-CAPILLARY: 229 mg/dL — AB (ref 70–99)

## 2014-02-26 LAB — ABO/RH: ABO/RH(D): A POS

## 2014-02-26 LAB — HCG, QUANTITATIVE, PREGNANCY: hCG, Beta Chain, Quant, S: 145 m[IU]/mL — ABNORMAL HIGH (ref <5)

## 2014-02-26 LAB — WET PREP, GENITAL
Clue Cells Wet Prep HPF POC: NONE SEEN
Trich, Wet Prep: NONE SEEN
WBC, Wet Prep HPF POC: NONE SEEN
Yeast Wet Prep HPF POC: NONE SEEN

## 2014-02-26 MED ORDER — MORPHINE SULFATE 4 MG/ML IJ SOLN
6.0000 mg | Freq: Once | INTRAMUSCULAR | Status: AC
  Administered 2014-02-26: 4 mg via INTRAVENOUS
  Filled 2014-02-26: qty 2

## 2014-02-26 MED ORDER — ONDANSETRON HCL 4 MG/2ML IJ SOLN
4.0000 mg | Freq: Once | INTRAMUSCULAR | Status: AC
  Administered 2014-02-26: 4 mg via INTRAVENOUS
  Filled 2014-02-26: qty 2

## 2014-02-26 MED ORDER — IBUPROFEN 800 MG PO TABS: 800.0000 mg | ORAL_TABLET | Freq: Three times a day (TID) | ORAL | Status: DC | PRN

## 2014-02-26 MED ORDER — SODIUM CHLORIDE 0.9 % IV BOLUS (SEPSIS)
1000.0000 mL | Freq: Once | INTRAVENOUS | Status: AC
  Administered 2014-02-26: 1000 mL via INTRAVENOUS

## 2014-02-26 MED ORDER — ONDANSETRON 4 MG PO TBDP: 4.0000 mg | ORAL_TABLET | Freq: Three times a day (TID) | ORAL | Status: DC | PRN

## 2014-02-26 MED ORDER — HYDROCODONE-ACETAMINOPHEN 5-325 MG PO TABS: 1.0000 | ORAL_TABLET | ORAL | Status: DC | PRN

## 2014-02-26 NOTE — Discharge Instructions (Signed)
Abdominal Pain, Women °Abdominal (stomach, pelvic, or belly) pain can be caused by many things. It is important to tell your doctor: °· The location of the pain. °· Does it come and go or is it present all the time? °· Are there things that start the pain (eating certain foods, exercise)? °· Are there other symptoms associated with the pain (fever, nausea, vomiting, diarrhea)? °All of this is helpful to know when trying to find the cause of the pain. °CAUSES  °· Stomach: virus or bacteria infection, or ulcer. °· Intestine: appendicitis (inflamed appendix), regional ileitis (Crohn's disease), ulcerative colitis (inflamed colon), irritable bowel syndrome, diverticulitis (inflamed diverticulum of the colon), or cancer of the stomach or intestine. °· Gallbladder disease or stones in the gallbladder. °· Kidney disease, kidney stones, or infection. °· Pancreas infection or cancer. °· Fibromyalgia (pain disorder). °· Diseases of the female organs: °¨ Uterus: fibroid (non-cancerous) tumors or infection. °¨ Fallopian tubes: infection or tubal pregnancy. °¨ Ovary: cysts or tumors. °¨ Pelvic adhesions (scar tissue). °¨ Endometriosis (uterus lining tissue growing in the pelvis and on the pelvic organs). °¨ Pelvic congestion syndrome (female organs filling up with blood just before the menstrual period). °¨ Pain with the menstrual period. °¨ Pain with ovulation (producing an egg). °¨ Pain with an IUD (intrauterine device, birth control) in the uterus. °¨ Cancer of the female organs. °· Functional pain (pain not caused by a disease, may improve without treatment). °· Psychological pain. °· Depression. °DIAGNOSIS  °Your doctor will decide the seriousness of your pain by doing an examination. °· Blood tests. °· X-rays. °· Ultrasound. °· CT scan (computed tomography, special type of X-ray). °· MRI (magnetic resonance imaging). °· Cultures, for infection. °· Barium enema (dye inserted in the large intestine, to better view it with  X-rays). °· Colonoscopy (looking in intestine with a lighted tube). °· Laparoscopy (minor surgery, looking in abdomen with a lighted tube). °· Major abdominal exploratory surgery (looking in abdomen with a large incision). °TREATMENT  °The treatment will depend on the cause of the pain.  °· Many cases can be observed and treated at home. °· Over-the-counter medicines recommended by your caregiver. °· Prescription medicine. °· Antibiotics, for infection. °· Birth control pills, for painful periods or for ovulation pain. °· Hormone treatment, for endometriosis. °· Nerve blocking injections. °· Physical therapy. °· Antidepressants. °· Counseling with a psychologist or psychiatrist. °· Minor or major surgery. °HOME CARE INSTRUCTIONS  °· Do not take laxatives, unless directed by your caregiver. °· Take over-the-counter pain medicine only if ordered by your caregiver. Do not take aspirin because it can cause an upset stomach or bleeding. °· Try a clear liquid diet (broth or water) as ordered by your caregiver. Slowly move to a bland diet, as tolerated, if the pain is related to the stomach or intestine. °· Have a thermometer and take your temperature several times a day, and record it. °· Bed rest and sleep, if it helps the pain. °· Avoid sexual intercourse, if it causes pain. °· Avoid stressful situations. °· Keep your follow-up appointments and tests, as your caregiver orders. °· If the pain does not go away with medicine or surgery, you may try: °¨ Acupuncture. °¨ Relaxation exercises (yoga, meditation). °¨ Group therapy. °¨ Counseling. °SEEK MEDICAL CARE IF:  °· You notice certain foods cause stomach pain. °· Your home care treatment is not helping your pain. °· You need stronger pain medicine. °· You want your IUD removed. °· You feel faint or   lightheaded. °· You develop nausea and vomiting. °· You develop a rash. °· You are having side effects or an allergy to your medicine. °SEEK IMMEDIATE MEDICAL CARE IF:  °· Your  pain does not go away or gets worse. °· You have a fever. °· Your pain is felt only in portions of the abdomen. The right side could possibly be appendicitis. The left lower portion of the abdomen could be colitis or diverticulitis. °· You are passing blood in your stools (bright red or black tarry stools, with or without vomiting). °· You have blood in your urine. °· You develop chills, with or without a fever. °· You pass out. °MAKE SURE YOU:  °· Understand these instructions. °· Will watch your condition. °· Will get help right away if you are not doing well or get worse. °Document Released: 04/06/2007 Document Revised: 10/24/2013 Document Reviewed: 04/26/2009 °[employer]® Patient Information ©2015 [employer]. This information is not intended to replace advice given to you by your health care provider. Make sure you discuss any questions you have with your health care provider. ° °

## 2014-02-26 NOTE — ED Notes (Signed)
CBG 229 

## 2014-02-26 NOTE — ED Notes (Signed)
Pt POC Urine Preg is POSITIVE PA Laveda Norman informed of 2 results in pt chart.

## 2014-02-26 NOTE — ED Notes (Signed)
Remains in US.

## 2014-02-26 NOTE — ED Provider Notes (Signed)
6:25 PM  Assumed care from Fayrene Helper, [place].  Pt is a 33 y.o. G2 P1 who had a recent miscarriage 2 weeks ago who presents with left adnexal pain. Labs are unremarkable.  Beta hCG today was 145. Discussed with her on-call OB/GYN who reports 3 days ago her her last beta hCG was 213. She also had ultrasound on 02/14/14 which showed a questionable gestational sac. Her ultrasound today is normal. No sign of torsion or adnexal mass. We'll have her followup with her OB/GYN as an outpatient. Urinalysis was not performed today. She is not complaining of dysuria, hematuria, urinary frequency or urgency. Have offered this test for her today which she states she would like discharge home and will followup with her OB/GYN for this. Have discussed return precautions and supportive care instructions. She verbalizes understanding and is comfortable with plan.  Layla Maw Kaito Schulenburg, DO 02/26/14 [phone]

## 2014-02-26 NOTE — ED Provider Notes (Signed)
Medical screening examination/treatment/procedure(s) were conducted as a shared visit with non-physician practitioner(s) and myself.  I personally evaluated the patient during the encounter.   EKG Interpretation None       Doug Sou, MD 02/26/14 [phone]

## 2014-02-26 NOTE — ED Provider Notes (Signed)
CSN: [id number]     Arrival date & time 02/26/14  1145 History   First MD Initiated Contact with Patient 02/26/14 1323     Chief Complaint  Patient presents with  . Abdominal Pain  . Dizziness     (Consider location/radiation/quality/duration/timing/severity/associated sxs/prior Treatment) HPI  This is a G24 P87 33 year old female with history of non-insulin-dependent diabetes presents complaining of low abnormal pain. Patient reports she had a miscarriage in which she developed in the low-normal cramping and vaginal bleeding for approximately 2 per week but vaginal bleeding has stopped for the past 6 days. She was doing well. This morning prior to going to church she experiencing low, cramping that was mild and resolved. During church she developed acute onset of sharp crampy low abnormal pain, nonradiating, 8/10. She tries using the bathroom but no improvement. Pain is eased a little bit but still persistent. She endorses nausea and feel lightheadedness. No fever, headache, chest pain, shortness of breath, productive cough, back pain, dysuria, hematuria, vaginal bleeding, vaginal discharge, hematochezia melena. No history of kidney stones.  Past Medical History  Diagnosis Date  . Diabetes mellitus without complication    Past Surgical History  Procedure Laterality Date  . Tonsillectomy    . Cesarean section     No family history on file. History  Substance Use Topics  . Smoking status: Never Smoker   . Smokeless tobacco: Not on file  . Alcohol Use: No   OB History   Grav Para Term Preterm Abortions TAB SAB Ect Mult Living                 Review of Systems  All other systems reviewed and are negative.     Allergies  Review of patient's allergies indicates no known allergies.  Home Medications   Prior to Admission medications   Medication Sig Start Date End Date Taking? Authorizing Provider  acetaminophen (TYLENOL) 500 MG tablet Take 1,500 mg by mouth every 6 (six) hours  as needed for mild pain.   Yes Historical Provider, MD  ibuprofen (ADVIL,MOTRIN) 200 MG tablet Take 800 mg by mouth every 6 (six) hours as needed for moderate pain.   Yes Historical Provider, MD  lovastatin (MEVACOR) 20 MG tablet Take 20 mg by mouth at bedtime.  01/04/14  Yes Historical Provider, MD  metFORMIN (GLUCOPHAGE) 1000 MG tablet Take 1,000 mg by mouth 2 (two) times daily with a meal.  01/04/14  Yes Historical Provider, MD   BP 124/73  Pulse 75  Temp(Src) 98 F (36.7 C) (Oral)  Resp 19  SpO2 99% Physical Exam  Nursing note and vitals reviewed. Constitutional: She is oriented to person, place, and time. She appears well-developed and well-nourished. No distress.  Morbidly obese Caucasian female appears to be in some discomfort, closing her eyes.  HENT:  Head: Normocephalic and atraumatic.  Eyes: Conjunctivae are normal.  Neck: Normal range of motion. Neck supple.  Cardiovascular: Normal rate and regular rhythm.   Pulmonary/Chest: Effort normal and breath sounds normal. She exhibits no tenderness.  Abdominal: Soft. Bowel sounds are normal. There is tenderness (Tenderness to left lower quadrant on palpation without guarding or rebound tenderness.).  Genitourinary: Vagina normal and uterus normal. There is no rash or lesion on the right labia. There is no rash or lesion on the left labia. Cervix exhibits no discharge. Right adnexum displays no mass and no tenderness. Left adnexum displays no mass and no tenderness. No erythema, tenderness or bleeding around the vagina. No vaginal  discharge found.  Chaperone present:  Patient is morbidly obese, therefore examination is limited due to the large body habitus. On pelvic examination, patient has tenderness to the right adnexa, I was unable to visualize the cervical os however I do not see any vaginal bleeding, discharge, or products of conception.  Lymphadenopathy:       Right: No inguinal adenopathy present.       Left: No inguinal  adenopathy present.  Neurological: She is alert and oriented to person, place, and time.    ED Course  Procedures (including critical care time)  1:50 PM Patient with recent miscarriage  presents with low abdominal discomfort. Her pain is located to L adnexal region, reproducible on exam.  Her pregnancy test is positive, her Sharene Butters is 145.  Given her pain and recent miscarriage, plan to obtain transvaginal US to ensure no retained product of conception, or possibility of missed ectopic pregnancy.  Need to r/o ovarian torsion as well.  Care discussed with Dr. Ethelda Chick.  4:10 PM Pt is a pt of OBGYN Dr. Harold Hedge.  I have attempted to call the office and leave a message for oncall provider to call back in regards to pt's recent evaluation for her miscarriage.  Care discussed with Dr. Elesa Massed who will continue to monitor pt and will dispo pending imaging.    4:18 PM I have spoken with oncall provider, Dr. Gillian Scarce, who report pt last abd [place] on 8/25 demonstrates questionable gestational sac, no evidence of ectopic.  Has last quant in office on 9/3 was 213.  If [place] is normal pt can be follow up in office next week for further care.  If abnormal, Dr. Langston Masker can be reached directly at [phone].    Labs Review Labs Reviewed  CBC - Abnormal; Notable for the following:    Hemoglobin 11.2 (*)    HCT 35.4 (*)    MCH 25.1 (*)    All other components within normal limits  BASIC METABOLIC PANEL - Abnormal; Notable for the following:    Glucose, Bld 195 (*)    All other components within normal limits  HCG, QUANTITATIVE, PREGNANCY - Abnormal; Notable for the following:    hCG, Beta Chain, Quant, S 145 (*)    All other components within normal limits  CBG MONITORING, ED - Abnormal; Notable for the following:    Glucose-Capillary 229 (*)    All other components within normal limits  POC URINE PREG, ED - Abnormal; Notable for the following:    Preg Test, Ur POSITIVE (*)    All other components  within normal limits  WET PREP, GENITAL  GC/CHLAMYDIA PROBE AMP  POC URINE PREG, ED  ABO/RH    Imaging Review No results found.   EKG Interpretation None      MDM   Final diagnoses:  None    BP 114/55  Pulse 66  Temp(Src) 98 F (36.7 C) (Oral)  Resp 19  SpO2 97%     Fayrene Helper, PA-C 02/26/14 1621

## 2014-02-26 NOTE — ED Notes (Signed)
Pt is here with lower abdominal pain and states had miscarriage one week ago and has stopped any vaginal bleeding now.  Pt reports dizziness that started one hour ago and is diabetic but did not check sugar.  Pt denies chest pain or sob.

## 2014-02-26 NOTE — ED Notes (Signed)
C/o lower abd. Pain sudden in onset 1100am c/o nauseated no vomited c/o weak and lightheaded. C/o feeling tired yest.

## 2014-02-26 NOTE — ED Notes (Signed)
Patient transported to Ultrasound 

## 2014-02-26 NOTE — ED Provider Notes (Signed)
compalins of left lower quadrant pain onset 11 AM today. Patient reports she had a miscarriage 2 weeks ago. Was fine until today. When she developed this pain. Patient states she stopped bleeding 6 days ago following her miscarriage   Doug Sou, MD 02/26/14 1517

## 2014-02-28 LAB — GC/CHLAMYDIA PROBE AMP
CT PROBE, AMP APTIMA: NEGATIVE
GC PROBE AMP APTIMA: NEGATIVE

## 2014-02-28 LAB — POC URINE PREG, ED
PREG TEST UR: POSITIVE — AB
Preg Test, Ur: POSITIVE — AB

## 2014-05-04 ENCOUNTER — Other Ambulatory Visit (HOSPITAL_COMMUNITY): Payer: Self-pay | Admitting: Obstetrics and Gynecology

## 2014-05-04 DIAGNOSIS — Z8759 Personal history of other complications of pregnancy, childbirth and the puerperium: Secondary | ICD-10-CM

## 2014-05-10 ENCOUNTER — Ambulatory Visit (HOSPITAL_COMMUNITY): Admission: RE | Admit: 2014-05-10 | Payer: BC Managed Care – PPO | Source: Ambulatory Visit

## 2014-10-12 ENCOUNTER — Encounter: Payer: Self-pay | Admitting: Internal Medicine

## 2014-10-12 ENCOUNTER — Ambulatory Visit (INDEPENDENT_AMBULATORY_CARE_PROVIDER_SITE_OTHER): Payer: 59 | Admitting: Internal Medicine

## 2014-10-12 VITALS — BP 126/74 | HR 74 | Temp 98.4°F | Ht 68.25 in | Wt 359.0 lb

## 2014-10-12 DIAGNOSIS — J302 Other seasonal allergic rhinitis: Secondary | ICD-10-CM | POA: Insufficient documentation

## 2014-10-12 DIAGNOSIS — E1165 Type 2 diabetes mellitus with hyperglycemia: Secondary | ICD-10-CM | POA: Insufficient documentation

## 2014-10-12 DIAGNOSIS — R519 Headache, unspecified: Secondary | ICD-10-CM | POA: Insufficient documentation

## 2014-10-12 DIAGNOSIS — K219 Gastro-esophageal reflux disease without esophagitis: Secondary | ICD-10-CM

## 2014-10-12 DIAGNOSIS — N926 Irregular menstruation, unspecified: Secondary | ICD-10-CM

## 2014-10-12 DIAGNOSIS — N910 Primary amenorrhea: Secondary | ICD-10-CM

## 2014-10-12 DIAGNOSIS — E785 Hyperlipidemia, unspecified: Secondary | ICD-10-CM | POA: Insufficient documentation

## 2014-10-12 DIAGNOSIS — IMO0002 Reserved for concepts with insufficient information to code with codable children: Secondary | ICD-10-CM | POA: Insufficient documentation

## 2014-10-12 DIAGNOSIS — E1141 Type 2 diabetes mellitus with diabetic mononeuropathy: Secondary | ICD-10-CM | POA: Diagnosis not present

## 2014-10-12 DIAGNOSIS — R51 Headache: Secondary | ICD-10-CM | POA: Diagnosis not present

## 2014-10-12 LAB — BASIC METABOLIC PANEL
BUN: 13 mg/dL (ref 6–23)
CHLORIDE: 102 meq/L (ref 96–112)
CO2: 25 meq/L (ref 19–32)
Calcium: 9.2 mg/dL (ref 8.4–10.5)
Creatinine, Ser: 0.74 mg/dL (ref 0.40–1.20)
GFR: 96.87 mL/min (ref >60.00)
Glucose, Bld: 174 mg/dL — ABNORMAL HIGH (ref 70–99)
POTASSIUM: 4 meq/L (ref 3.5–5.1)
Sodium: 134 mEq/L — ABNORMAL LOW (ref 135–145)

## 2014-10-12 LAB — HEMOGLOBIN A1C: Hgb A1c MFr Bld: 7.7 % — ABNORMAL HIGH (ref 4.6–6.5)

## 2014-10-12 LAB — POCT URINE PREGNANCY: PREG TEST UR: NEGATIVE

## 2014-10-12 MED ORDER — PANTOPRAZOLE SODIUM 40 MG PO TBEC: 40.0000 mg | DELAYED_RELEASE_TABLET | Freq: Every day | ORAL | Status: DC

## 2014-10-12 MED ORDER — METFORMIN HCL 1000 MG PO TABS: 1000.0000 mg | ORAL_TABLET | Freq: Two times a day (BID) | ORAL | Status: DC

## 2014-10-12 NOTE — Patient Instructions (Signed)
Fat and Cholesterol Control Diet Fat and cholesterol levels in your blood and organs are influenced by your diet. High levels of fat and cholesterol may lead to diseases of the heart, small and large blood vessels, gallbladder, liver, and pancreas. CONTROLLING FAT AND CHOLESTEROL WITH DIET Although exercise and lifestyle factors are important, your diet is key. That is because certain foods are known to raise cholesterol and others to lower it. The goal is to balance foods for their effect on cholesterol and more importantly, to replace saturated and trans fat with other types of fat, such as monounsaturated fat, polyunsaturated fat, and omega-3 fatty acids. On average, a person should consume no more than 15 to 17 g of saturated fat daily. Saturated and trans fats are considered "bad" fats, and they will raise LDL cholesterol. Saturated fats are primarily found in animal products such as meats, butter, and cream. However, that does not mean you need to give up all your favorite foods. Today, there are good tasting, low-fat, low-cholesterol substitutes for most of the things you like to eat. Choose low-fat or nonfat alternatives. Choose round or loin cuts of red meat. These types of cuts are lowest in fat and cholesterol. Chicken (without the skin), fish, veal, and ground turkey breast are great choices. Eliminate fatty meats, such as hot dogs and salami. Even shellfish have little or no saturated fat. Have a 3 oz (85 g) portion when you eat lean meat, poultry, or fish. Trans fats are also called "partially hydrogenated oils." They are oils that have been scientifically manipulated so that they are solid at room temperature resulting in a longer shelf life and improved taste and texture of foods in which they are added. Trans fats are found in stick margarine, some tub margarines, cookies, crackers, and baked goods.  When baking and cooking, oils are a great substitute for butter. The monounsaturated oils are  especially beneficial since it is believed they lower LDL and raise HDL. The oils you should avoid entirely are saturated tropical oils, such as coconut and palm.  Remember to eat a lot from food groups that are naturally free of saturated and trans fat, including fish, fruit, vegetables, beans, grains (barley, rice, couscous, bulgur wheat), and pasta (without cream sauces).  IDENTIFYING FOODS THAT LOWER FAT AND CHOLESTEROL  Soluble fiber may lower your cholesterol. This type of fiber is found in fruits such as apples, vegetables such as broccoli, potatoes, and carrots, legumes such as beans, peas, and lentils, and grains such as barley. Foods fortified with plant sterols (phytosterol) may also lower cholesterol. You should eat at least 2 g per day of these foods for a cholesterol lowering effect.  Read package labels to identify low-saturated fats, trans fat free, and low-fat foods at the supermarket. Select cheeses that have only 2 to 3 g saturated fat per ounce. Use a heart-healthy tub margarine that is free of trans fats or partially hydrogenated oil. When buying baked goods (cookies, crackers), avoid partially hydrogenated oils. Breads and muffins should be made from whole grains (whole-wheat or whole oat flour, instead of "flour" or "enriched flour"). Buy non-creamy canned soups with reduced salt and no added fats.  FOOD PREPARATION TECHNIQUES  Never deep-fry. If you must fry, either stir-fry, which uses very little fat, or use non-stick cooking sprays. When possible, broil, bake, or roast meats, and steam vegetables. Instead of putting butter or margarine on vegetables, use lemon and herbs, applesauce, and cinnamon (for squash and sweet potatoes). Use nonfat   yogurt, salsa, and low-fat dressings for salads.  LOW-SATURATED FAT / LOW-FAT FOOD SUBSTITUTES Meats / Saturated Fat (g)  Avoid: Steak, marbled (3 oz/85 g) / 11 g  Choose: Steak, lean (3 oz/85 g) / 4 g  Avoid: Hamburger (3 oz/85 g) / 7  g  Choose: Hamburger, lean (3 oz/85 g) / 5 g  Avoid: Ham (3 oz/85 g) / 6 g  Choose: Ham, lean cut (3 oz/85 g) / 2.4 g  Avoid: Chicken, with skin, dark meat (3 oz/85 g) / 4 g  Choose: Chicken, skin removed, dark meat (3 oz/85 g) / 2 g  Avoid: Chicken, with skin, light meat (3 oz/85 g) / 2.5 g  Choose: Chicken, skin removed, light meat (3 oz/85 g) / 1 g Dairy / Saturated Fat (g)  Avoid: Whole milk (1 cup) / 5 g  Choose: Low-fat milk, 2% (1 cup) / 3 g  Choose: Low-fat milk, 1% (1 cup) / 1.5 g  Choose: Skim milk (1 cup) / 0.3 g  Avoid: Hard cheese (1 oz/28 g) / 6 g  Choose: Skim milk cheese (1 oz/28 g) / 2 to 3 g  Avoid: Cottage cheese, 4% fat (1 cup) / 6.5 g  Choose: Low-fat cottage cheese, 1% fat (1 cup) / 1.5 g  Avoid: Ice cream (1 cup) / 9 g  Choose: Sherbet (1 cup) / 2.5 g  Choose: Nonfat frozen yogurt (1 cup) / 0.3 g  Choose: Frozen fruit bar / trace  Avoid: Whipped cream (1 tbs) / 3.5 g  Choose: Nondairy whipped topping (1 tbs) / 1 g Condiments / Saturated Fat (g)  Avoid: Mayonnaise (1 tbs) / 2 g  Choose: Low-fat mayonnaise (1 tbs) / 1 g  Avoid: Butter (1 tbs) / 7 g  Choose: Extra light margarine (1 tbs) / 1 g  Avoid: Coconut oil (1 tbs) / 11.8 g  Choose: Olive oil (1 tbs) / 1.8 g  Choose: Corn oil (1 tbs) / 1.7 g  Choose: Safflower oil (1 tbs) / 1.2 g  Choose: Sunflower oil (1 tbs) / 1.4 g  Choose: Soybean oil (1 tbs) / 2.4 g  Choose: Canola oil (1 tbs) / 1 g Document Released: 06/09/2005 Document Revised: 10/04/2012 Document Reviewed: 09/07/2013 [employer] Patient Information 2015 [employer]. This information is not intended to replace advice given to you by your health care provider. Make sure you discuss any questions you have with your health care provider.  

## 2014-10-12 NOTE — Addendum Note (Signed)
Addended by: Roena MaladyEVONTENNO, Collin Rengel Y on: 10/12/2014 11:06 AM   Modules accepted: Orders

## 2014-10-12 NOTE — Assessment & Plan Note (Signed)
Worse in spring Continue Claritin prn

## 2014-10-12 NOTE — Assessment & Plan Note (Signed)
She reports this is diet controlled Will request her last set of labs done 06/2014 from prior PCP Handout given on low fat diet

## 2014-10-12 NOTE — Progress Notes (Signed)
HPI  Pt presents to the clinic today to establish care and for management of the conditions listed below. She is transferring care from Dr. Jeannetta Nap.  DM 2: She does not check sugars. Last eye exam was 2 years ago. She reports she does have numbness in her feet for the past few years but does not have treatment for it. She is taking Metformin and Amaryl daily. She has not had a flu shot in the last few years. She is not sure if she has had a pneumonia shot.  GERD: She reports she is having heartburn almost on daily basis. It is usually triggered by greasy foods, tomato based products. She reports she has take multiple OTC medications and nothing seems to work longer than a few weeks.  Headaches: Occur about 1 x per month. Starts in the forehead, radiates to back of head. She reports it feels sharp, stabbing and tight. She takes Ibuprofen without any relief. Not always associated wit her period.  Allergy: Usually worse in the spring. She gets good relief with OTC Claritin, she only takes as needed.  HLD: History of this, last check was WNL, checked in 06/2014. She was on Lovastatin in the past.  Flu: never Tetanus: 2008 Pneumovax: never LMP: 09/11/14, usually regular, but is late this month, no birth control Pap Smear: 01/2014- normal Dentist: prn  Past Medical History  Diagnosis Date  . Diabetes mellitus without complication   . Chicken pox   . GERD (gastroesophageal reflux disease)   . Frequent headaches   . Allergy   . Hyperlipidemia     Current Outpatient Prescriptions  Medication Sig Dispense Refill  . glimepiride (AMARYL) 4 MG tablet Take 0.5 tablets by mouth daily.  4  . ibuprofen (ADVIL,MOTRIN) 200 MG tablet Take 800 mg by mouth every 6 (six) hours as needed for moderate pain.    . metFORMIN (GLUCOPHAGE) 1000 MG tablet Take 1,000 mg by mouth 2 (two) times daily with a meal. 1 tablet in morning, 1 1/2 tablet at night    . Prenatal MV-Min-Fe Fum-FA-DHA (PRENATAL 1 PO) Take 1  capsule by mouth daily.     No current facility-administered medications for this visit.    No Known Allergies  Family History  Problem Relation Age of Onset  . Arthritis Mother   . Hyperlipidemia Mother   . Hypertension Mother   . Diabetes Mother   . Arthritis Father   . Cancer Father     prostate  . Hyperlipidemia Father   . Hypertension Father   . Diabetes Father   . Diabetes Sister   . Arthritis Maternal Grandmother   . Diabetes Maternal Grandmother   . Arthritis Maternal Grandfather   . Diabetes Maternal Grandfather   . Arthritis Paternal Grandmother   . Hypertension Paternal Grandmother   . Diabetes Paternal Grandmother   . Arthritis Paternal Grandfather   . Stroke Paternal Grandfather   . Diabetes Paternal Grandfather     History   Social History  . Marital Status: Married    Spouse Name: N/A  . Number of Children: N/A  . Years of Education: N/A   Occupational History  . Not on file.   Social History Main Topics  . Smoking status: Never Smoker   . Smokeless tobacco: Never Used  . Alcohol Use: No  . Drug Use: No  . Sexual Activity: Not on file   Other Topics Concern  . Not on file   Social History Narrative    ROS:  Constitutional: Denies fever, malaise, fatigue, headache or abrupt weight changes.  HEENT: Denies eye pain, eye redness, ear pain, ringing in the ears, wax buildup, runny nose, nasal congestion, bloody nose, or sore throat. Respiratory: Denies difficulty breathing, shortness of breath, cough or sputum production.   Cardiovascular: Denies chest pain, chest tightness, palpitations or swelling in the hands or feet.  Gastrointestinal: Denies abdominal pain, bloating, constipation, diarrhea or blood in the stool.  GU: Pt reports late menses. Denies frequency, urgency, pain with urination, blood in urine, odor or discharge. Skin: Denies redness, rashes, lesions or ulcercations.  Neurological: Pt reports numbness in feet. Denies dizziness,  difficulty with memory, difficulty with speech or problems with balance and coordination.  Psych: Pt reports mild depression. Denies anxiety, SI/HI.  No other specific complaints in a complete review of systems (except as listed in HPI above).  PE:  BP 126/74 mmHg  Pulse 74  Temp(Src) 98.4 F (36.9 C) (Oral)  Ht 5' 8.25" (1.734 m)  Wt 359 lb (162.841 kg)  BMI 54.16 kg/m2  SpO2 98%  LMP 09/11/2014  Wt Readings from Last 3 Encounters:  10/12/14 359 lb (162.841 kg)    General: Appears her stated age, obese in NAD. Skin: Warm, dry and intact. No rashes noted. HEENT: Head: normal shape and size; Eyes: sclera white, no icterus, conjunctiva pink, PERRLA and EOMs intact; Ears: Tm's gray and intact, normal light reflex; Nose: mucosa boggy and moist, septum midline; Throat/Mouth: Teeth present, mucosa pink and moist, no lesions or ulcerations noted.   Cardiovascular: Normal rate and rhythm. S1,S2 noted.  No murmur, rubs or gallops noted.  Pulmonary/Chest: Normal effort and positive vesicular breath sounds. No respiratory distress. No wheezes, rales or ronchi noted.  Abdomen: Soft and nontender. Normal bowel sounds, no bruits noted. No distention or masses noted. Liver, spleen and kidneys non palpable. Neurological: Alert and oriented.  Psychiatric: Mood and affect flat. Behavior is normal. Judgment and thought content normal.    BMET    Component Value Date/Time   NA 139 02/26/2014 1253   K 4.5 02/26/2014 1253   CL 102 02/26/2014 1253   CO2 25 02/26/2014 1253   GLUCOSE 195* 02/26/2014 1253   BUN 9 02/26/2014 1253   CREATININE 0.74 02/26/2014 1253   CREATININE 0.78 06/03/2007 1626   CALCIUM 8.5 02/26/2014 1253   GFRNONAA >90 02/26/2014 1253   GFRAA >90 02/26/2014 1253    Lipid Panel  No results found for: CHOL, TRIG, HDL, CHOLHDL, VLDL, LDLCALC  CBC    Component Value Date/Time   WBC 7.7 02/26/2014 1253   RBC 4.46 02/26/2014 1253   HGB 11.2* 02/26/2014 1253   HCT 35.4*  02/26/2014 1253   PLT 221 02/26/2014 1253   MCV 79.4 02/26/2014 1253   MCH 25.1* 02/26/2014 1253   MCHC 31.6 02/26/2014 1253   RDW 14.3 02/26/2014 1253    Hgb A1C No results found for: HGBA1C   Assessment and Plan:  Late Menses:  Will check urine hcg today- negative  RTC in 6 months to follow up chronic conditions

## 2014-10-12 NOTE — Assessment & Plan Note (Signed)
Moderate OTC's not effective Will start Protonix daily Will check CMET at next visit Discussed avoiding foods that trigger her reflux

## 2014-10-12 NOTE — Progress Notes (Signed)
Pre visit review using our clinic review tool, if applicable. No additional management support is needed unless otherwise documented below in the visit note. 

## 2014-10-12 NOTE — Assessment & Plan Note (Signed)
Will repeat A1C today She declines flu and pneumovax today Will check prior records to see if she had done at prior PCP Foot exam today Encouraged her to make an appt for an eye exam Continue Metformin and Amaryl, Metformin refilled today

## 2014-10-12 NOTE — Assessment & Plan Note (Signed)
?   If they are hormonal Only occuring 1 x month, Continue Ibuprofen or Excedrin Migraine She will let me know if becomes more frequent Will monitor for now

## 2015-02-15 ENCOUNTER — Other Ambulatory Visit: Payer: Self-pay | Admitting: Family Medicine

## 2015-02-27 ENCOUNTER — Ambulatory Visit (INDEPENDENT_AMBULATORY_CARE_PROVIDER_SITE_OTHER): Payer: 59 | Admitting: Internal Medicine

## 2015-02-27 ENCOUNTER — Encounter: Payer: Self-pay | Admitting: Internal Medicine

## 2015-02-27 VITALS — BP 122/86 | HR 89 | Temp 97.9°F | Wt 366.0 lb

## 2015-02-27 DIAGNOSIS — H6123 Impacted cerumen, bilateral: Secondary | ICD-10-CM | POA: Diagnosis not present

## 2015-02-27 DIAGNOSIS — B349 Viral infection, unspecified: Secondary | ICD-10-CM

## 2015-02-27 DIAGNOSIS — B9789 Other viral agents as the cause of diseases classified elsewhere: Secondary | ICD-10-CM

## 2015-02-27 DIAGNOSIS — J329 Chronic sinusitis, unspecified: Secondary | ICD-10-CM | POA: Diagnosis not present

## 2015-02-27 NOTE — Patient Instructions (Signed)

## 2015-02-27 NOTE — Progress Notes (Signed)
Subjective:    Patient ID: Deborah Francis, female    DOB: 11/24/1980, 34 y.o.   MRN: [id number]  Otalgia  Associated symptoms include coughing and rhinorrhea. Pertinent negatives include no neck pain or sore throat.  Dizziness Associated symptoms include chills, congestion, coughing and fatigue. Pertinent negatives include no chest pain, fever, myalgias, neck pain or sore throat.  Sinusitis Associated symptoms include chills, congestion, coughing, ear pain and sinus pressure. Pertinent negatives include no neck pain, shortness of breath or sore throat.   Deborah Francis is a 34 year old female who presents today with chief complaint of bilateral otalgia, dizziness and facial pain.  Her otalgia started about one month ago with some loss of hearing.  She has not had any drainage from her ears.  She has taken some tylenol without relief.  When she wakes up in the morning she feels like her ears are clogged.   This morning she woke up feeling dizzy and congested.  She denies sore throat, fever.  She endorses dry cough and congestion lasting one day.  She has not taken anything for symptoms.     Review of Systems  Constitutional: Positive for chills and fatigue. Negative for fever.  HENT: Positive for congestion, ear pain, rhinorrhea and sinus pressure. Negative for sore throat.   Respiratory: Positive for cough. Negative for shortness of breath and wheezing.   Cardiovascular: Negative for chest pain, palpitations and leg swelling.  Gastrointestinal: Negative.   Genitourinary: Negative.   Musculoskeletal: Negative for myalgias, back pain and neck pain.  Skin: Negative.   Neurological: Positive for dizziness.   Current Outpatient Prescriptions on File Prior to Visit  Medication Sig Dispense Refill  . glimepiride (AMARYL) 4 MG tablet Take 0.5 tablets by mouth daily.  4  . ibuprofen (ADVIL,MOTRIN) 200 MG tablet Take 800 mg by mouth every 6 (six) hours as needed for moderate pain.    . metFORMIN  (GLUCOPHAGE) 1000 MG tablet Take 1 tablet (1,000 mg total) by mouth 2 (two) times daily with a meal. 1 tablet in morning, 1 1/2 tablet at night 75 tablet 2  . pantoprazole (PROTONIX) 40 MG tablet TAKE 1 TABLET BY MOUTH DAILY 30 tablet 5  . Prenatal MV-Min-Fe Fum-FA-DHA (PRENATAL 1 PO) Take 1 capsule by mouth daily.     No current facility-administered medications on file prior to visit.   Family History  Problem Relation Age of Onset  . Arthritis Mother   . Hyperlipidemia Mother   . Hypertension Mother   . Diabetes Mother   . Arthritis Father   . Cancer Father     prostate  . Hyperlipidemia Father   . Hypertension Father   . Diabetes Father   . Diabetes Sister   . Arthritis Maternal Grandmother   . Diabetes Maternal Grandmother   . Arthritis Maternal Grandfather   . Diabetes Maternal Grandfather   . Arthritis Paternal Grandmother   . Hypertension Paternal Grandmother   . Diabetes Paternal Grandmother   . Arthritis Paternal Grandfather   . Stroke Paternal Grandfather   . Diabetes Paternal Grandfather         Objective:   Physical Exam  Constitutional: She is oriented to person, place, and time. She appears well-developed and well-nourished. No distress.  HENT:  Head: Normocephalic and atraumatic.  Impacted, thick cerumen bilateral ears.   Eyes: Pupils are equal, round, and reactive to light.  Neck: Normal range of motion. Neck supple.  Cardiovascular: Normal rate and regular rhythm.  No murmur heard. Pulmonary/Chest: Effort normal and breath sounds normal.  Abdominal: Soft.  Musculoskeletal: Normal range of motion.  Lymphadenopathy:    She has no cervical adenopathy.  Neurological: She is alert and oriented to person, place, and time.  Skin: Skin is warm and dry.  Psychiatric: She has a normal mood and affect.    BP 122/86 mmHg  Pulse 89  Temp(Src) 97.9 F (36.6 C) (Oral)  Wt 366 lb (166.017 kg)  SpO2 98%  LMP 02/16/2015        Assessment & Plan:  1.  Otalgia  -Will flush ears in office today to remove cerumen. - Instructed patient to avoid using Qtips at home.    2. Allergic Sinusitis  - Instructed patient to use flonase and zyrtec. - call office for appointment if symptoms are not better in one week or if fever develops.

## 2015-02-27 NOTE — Progress Notes (Signed)
HPI  Pt presents to the clinic today with c/o bilateral ear pain. She reports this started 1 month ago, and has been intermittent during that time. Yesterday, her ear pain got worse, she did have some associated dizziness, facial pain and pressure. She has noted a decreased loss in hearing. She denies fever but has had chills. She has tried Tylenol and an allergy pill (only once) without any relief. She does have a history of seasonal allergies. She has not had sick contacts that she is aware of.  Review of Systems    Past Medical History  Diagnosis Date  . Diabetes mellitus without complication   . Chicken pox   . GERD (gastroesophageal reflux disease)   . Frequent headaches   . Allergy   . Hyperlipidemia     Family History  Problem Relation Age of Onset  . Arthritis Mother   . Hyperlipidemia Mother   . Hypertension Mother   . Diabetes Mother   . Arthritis Father   . Cancer Father     prostate  . Hyperlipidemia Father   . Hypertension Father   . Diabetes Father   . Diabetes Sister   . Arthritis Maternal Grandmother   . Diabetes Maternal Grandmother   . Arthritis Maternal Grandfather   . Diabetes Maternal Grandfather   . Arthritis Paternal Grandmother   . Hypertension Paternal Grandmother   . Diabetes Paternal Grandmother   . Arthritis Paternal Grandfather   . Stroke Paternal Grandfather   . Diabetes Paternal Grandfather     Social History   Social History  . Marital Status: Married    Spouse Name: N/A  . Number of Children: N/A  . Years of Education: N/A   Occupational History  . Not on file.   Social History Main Topics  . Smoking status: Never Smoker   . Smokeless tobacco: Never Used  . Alcohol Use: No  . Drug Use: No  . Sexual Activity: Yes   Other Topics Concern  . Not on file   Social History Narrative    No Known Allergies   Constitutional: Positive headache. Denies fatigue, fever or abrupt weight changes.  HEENT:  Positive facial pain,  nasal congestion, and ear pain. Denies eye redness, ear pain, ringing in the ears, wax buildup, runny nose, or sore throat. Respiratory: Pt reports cough. Denies difficulty breathing or shortness of breath.  Cardiovascular: Denies chest pain, chest tightness, palpitations or swelling in the hands or feet.   No other specific complaints in a complete review of systems (except as listed in HPI above).  Objective:  BP 122/86 mmHg  Pulse 89  Temp(Src) 97.9 F (36.6 C) (Oral)  Wt 366 lb (166.017 kg)  SpO2 98%  LMP 02/16/2015   General: Appears her stated age,  in NAD. HEENT: Head: normal shape and size, mild maxillary sinus tenderness noted; Eyes: sclera white, no icterus, conjunctiva pink; Ears: bilateral cerumen impaction; Nose: mucosa boggy and moist, septum midline; Throat/Mouth: + PND. Teeth present, mucosa pink and moist, no exudate noted, no lesions or ulcerations noted.  Neck:  No cervical adenopathy noted. Cardiovascular: Normal rate and rhythm. S1,S2 noted.  No murmur, rubs or gallops noted.  Pulmonary/Chest: Normal effort and positive vesicular breath sounds. No respiratory distress. No wheezes, rales or ronchi noted.      Assessment & Plan:   Acute viral sinusitis  Can use a Neti Pot which can be purchased from your local drug store. Flonase 2 sprays each nostril for 3 days and  then as needed. Zyrtec daily x 7-10 days  Bilateral Cerumen Impaction:  Manual Lavage by CMA Discussed trying Debrox solution 2 x week to prevent wax buildup  RTC as needed or if symptoms persist.

## 2015-02-27 NOTE — Progress Notes (Signed)
Pre visit review using our clinic review tool, if applicable. No additional management support is needed unless otherwise documented below in the visit note. 

## 2015-03-14 ENCOUNTER — Other Ambulatory Visit: Payer: Self-pay

## 2015-03-14 DIAGNOSIS — E1141 Type 2 diabetes mellitus with diabetic mononeuropathy: Secondary | ICD-10-CM

## 2015-03-14 MED ORDER — METFORMIN HCL 1000 MG PO TABS: ORAL_TABLET | ORAL | Status: DC

## 2015-03-14 NOTE — Telephone Encounter (Signed)
Pt request refill metformin to [facility]; pt has appt 04/12/15 and per protocol will refill # 75 x 2.pt notified done.

## 2015-04-11 ENCOUNTER — Telehealth: Payer: Self-pay

## 2015-04-11 NOTE — Telephone Encounter (Signed)
I just sent a message about patient stating that her Rx for Katrina Stackstradol was denied. I made a mistake and the correct medication that needs to be sent in Glimpiride. Please advise. Thanks!

## 2015-04-11 NOTE — Telephone Encounter (Signed)
I received a phone message from patient stating that her Deborah Francis was denied and she is not sure why. She states that she was told it was denied because she is no longer a patient here, but she has an appt with [name] tomorrow. Is it okay to refill this medication?

## 2015-04-12 ENCOUNTER — Ambulatory Visit (INDEPENDENT_AMBULATORY_CARE_PROVIDER_SITE_OTHER): Payer: 59 | Admitting: Internal Medicine

## 2015-04-12 ENCOUNTER — Encounter: Payer: Self-pay | Admitting: Internal Medicine

## 2015-04-12 ENCOUNTER — Encounter (INDEPENDENT_AMBULATORY_CARE_PROVIDER_SITE_OTHER): Payer: Self-pay

## 2015-04-12 VITALS — BP 132/76 | HR 72 | Temp 98.3°F | Wt 373.0 lb

## 2015-04-12 DIAGNOSIS — K219 Gastro-esophageal reflux disease without esophagitis: Secondary | ICD-10-CM

## 2015-04-12 DIAGNOSIS — F411 Generalized anxiety disorder: Secondary | ICD-10-CM | POA: Diagnosis not present

## 2015-04-12 DIAGNOSIS — E785 Hyperlipidemia, unspecified: Secondary | ICD-10-CM

## 2015-04-12 DIAGNOSIS — E119 Type 2 diabetes mellitus without complications: Secondary | ICD-10-CM | POA: Diagnosis not present

## 2015-04-12 DIAGNOSIS — J302 Other seasonal allergic rhinitis: Secondary | ICD-10-CM

## 2015-04-12 DIAGNOSIS — R51 Headache: Secondary | ICD-10-CM

## 2015-04-12 DIAGNOSIS — Z23 Encounter for immunization: Secondary | ICD-10-CM

## 2015-04-12 DIAGNOSIS — R519 Headache, unspecified: Secondary | ICD-10-CM

## 2015-04-12 LAB — CBC
HCT: 35.1 % — ABNORMAL LOW (ref 36.0–46.0)
HEMOGLOBIN: 11.2 g/dL — AB (ref 12.0–15.0)
MCHC: 32 g/dL (ref 30.0–36.0)
MCV: 75 fl — ABNORMAL LOW (ref 78.0–100.0)
Platelets: 246 10*3/uL (ref 150.0–400.0)
RBC: 4.68 Mil/uL (ref 3.87–5.11)
RDW: 15.7 % — ABNORMAL HIGH (ref 11.5–15.5)
WBC: 9.4 10*3/uL (ref 4.0–10.5)

## 2015-04-12 LAB — MICROALBUMIN / CREATININE URINE RATIO
CREATININE, U: 267.2 mg/dL
MICROALB UR: 1 mg/dL (ref 0.0–1.9)
MICROALB/CREAT RATIO: 0.4 mg/g (ref 0.0–30.0)

## 2015-04-12 LAB — COMPREHENSIVE METABOLIC PANEL
ALK PHOS: 72 U/L (ref 39–117)
ALT: 12 U/L (ref 0–35)
AST: 9 U/L (ref 0–37)
Albumin: 3.8 g/dL (ref 3.5–5.2)
BILIRUBIN TOTAL: 0.3 mg/dL (ref 0.2–1.2)
BUN: 15 mg/dL (ref 6–23)
CO2: 26 mEq/L (ref 19–32)
CREATININE: 0.76 mg/dL (ref 0.40–1.20)
Calcium: 9 mg/dL (ref 8.4–10.5)
Chloride: 101 mEq/L (ref 96–112)
GFR: 93.64 mL/min (ref >60.00)
Glucose, Bld: 204 mg/dL — ABNORMAL HIGH (ref 70–99)
Potassium: 4.4 mEq/L (ref 3.5–5.1)
Sodium: 135 mEq/L (ref 135–145)
Total Protein: 7 g/dL (ref 6.0–8.3)

## 2015-04-12 LAB — LIPID PANEL
Cholesterol: 149 mg/dL (ref 0–200)
HDL: 49.8 mg/dL (ref >39.00)
LDL Cholesterol: 76 mg/dL (ref 0–99)
NonHDL: 99.19
TRIGLYCERIDES: 118 mg/dL (ref 0.0–149.0)
Total CHOL/HDL Ratio: 3
VLDL: 23.6 mg/dL (ref 0.0–40.0)

## 2015-04-12 LAB — HEMOGLOBIN A1C: Hgb A1c MFr Bld: 8.2 % — ABNORMAL HIGH (ref 4.6–6.5)

## 2015-04-12 MED ORDER — CITALOPRAM HYDROBROMIDE 20 MG PO TABS: 20.0000 mg | ORAL_TABLET | Freq: Every day | ORAL | Status: DC

## 2015-04-12 MED ORDER — PANTOPRAZOLE SODIUM 40 MG PO TBEC: 20.0000 mg | DELAYED_RELEASE_TABLET | Freq: Every day | ORAL | Status: DC

## 2015-04-12 MED ORDER — GLIMEPIRIDE 4 MG PO TABS: 2.0000 mg | ORAL_TABLET | Freq: Every day | ORAL | Status: DC

## 2015-04-12 MED ORDER — ALPRAZOLAM 0.5 MG PO TABS: 0.5000 mg | ORAL_TABLET | Freq: Every day | ORAL | Status: DC | PRN

## 2015-04-12 NOTE — Assessment & Plan Note (Signed)
Family stress Support offered today Will start Celexa and prn Xanax  She will update me in 4 weeks and let me know how she is doing

## 2015-04-12 NOTE — Assessment & Plan Note (Signed)
Will check Lipid Profile and CMET today Encouraged her to consume a low fat diet If LDL not < 100, will discuss starting statin therapy

## 2015-04-12 NOTE — Progress Notes (Signed)
Pre visit review using our clinic review tool, if applicable. No additional management support is needed unless otherwise documented below in the visit note. 

## 2015-04-12 NOTE — Assessment & Plan Note (Signed)
Continue OTC antihistamine prn

## 2015-04-12 NOTE — Progress Notes (Signed)
Subjective:    Patient ID: Deborah Francis, female    DOB: [birth year], 34 y.o.   MRN: [id number]  HPI  Pt presents to to the clinic today for 6 month follow up of chronic conditions:  Frequent Headaches: The headaches occur in her right temple. The pain radiates to her forehead and to the back of her head. She describes the pain as tightness, aching.  She does have sensitivity to light but not sound. She does have nausea but denies vomiting. She takes Advil as needed but reports it is not very effective.  GERD: She denies breakthrough symptoms on Protonix. She does try to avoid foods that make her reflux worse.  HLD: There is no Lipid Profile on file. She does not consume a low fat diet. She is not on any cholesterol lowering medication  Seasonal Allergies: Worse in the Spring and Fall. She takes an OTC antihistamine as needed.  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 She is prescribed Metformin and Amaryl but reports that she often forgets to take her evening dose. She does not test her sugar because she does not have a meter. Her last eye exam was 2 years ago. Flu 02/2014. Pneumovax never. She denies any numbness and tingling in her hands or feet.  Obesity: She has a BMI of 56.3. Her weight today is 373 lbs. She does not adhere to any specific diet or exercise regimen.  Anxiety: She reports this has been an ongoing problem. Her son has severe ADHD and ODD. This stresses her out on a daily basis. She reports she has panic attacks about 1-2 times per week. She has never been treated for anxiety or depression in the past.  Review of Systems  Past Medical History  Diagnosis Date  . Diabetes mellitus without complication (HCC)   . Chicken pox   . GERD (gastroesophageal reflux disease)   . Frequent headaches   . Allergy   . Hyperlipidemia     Current Outpatient Prescriptions  Medication Sig Dispense Refill  . glimepiride (AMARYL) 4 MG tablet Take 0.5 tablets (2 mg total) by mouth  daily. 30 tablet 0  . ibuprofen (ADVIL,MOTRIN) 200 MG tablet Take 800 mg by mouth every 6 (six) hours as needed for moderate pain.    . metFORMIN (GLUCOPHAGE) 1000 MG tablet 1 tablet in morning, 1 1/2 tablet at night 75 tablet 2  . pantoprazole (PROTONIX) 40 MG tablet TAKE 1 TABLET BY MOUTH DAILY 30 tablet 5  . Prenatal MV-Min-Fe Fum-FA-DHA (PRENATAL 1 PO) Take 1 capsule by mouth daily.     No current facility-administered medications for this visit.    No Known Allergies  Family History  Problem Relation Age of Onset  . Arthritis Mother   . Hyperlipidemia Mother   . Hypertension Mother   . Diabetes Mother   . Arthritis Father   . Cancer Father     prostate  . Hyperlipidemia Father   . Hypertension Father   . Diabetes Father   . Diabetes Sister   . Arthritis Maternal Grandmother   . Diabetes Maternal Grandmother   . Arthritis Maternal Grandfather   . Diabetes Maternal Grandfather   . Arthritis Paternal Grandmother   . Hypertension Paternal Grandmother   . Diabetes Paternal Grandmother   . Arthritis Paternal Grandfather   . Stroke Paternal Grandfather   . Diabetes Paternal Grandfather     Social History   Social History  . Marital Status: Married    Spouse  Name: N/A  . Number of Children: N/A  . Years of Education: N/A   Occupational History  . Not on file.   Social History Main Topics  . Smoking status: Never Smoker   . Smokeless tobacco: Never Used  . Alcohol Use: No  . Drug Use: No  . Sexual Activity: Yes   Other Topics Concern  . Not on file   Social History Narrative     Constitutional: Pt reports headache. Denies fever, malaise, fatigue, or abrupt weight changes.  HEENT: Denies eye pain, eye redness, ear pain, ringing in the ears, wax buildup, runny nose, nasal congestion, bloody nose, or sore throat. Respiratory: Denies difficulty breathing, shortness of breath, cough or sputum production.   Cardiovascular: Denies chest pain, chest tightness,  palpitations or swelling in the hands or feet.  Gastrointestinal: Denies abdominal pain, bloating, constipation, diarrhea or blood in the stool.  Skin: Denies redness, rashes, lesions or ulcercations.  Neurological: Denies dizziness, difficulty with memory, difficulty with speech or problems with balance and coordination.  Psych: Pt reports anxiety. Denies depression, SI/HI.  No other specific complaints in a complete review of systems (except as listed in HPI above).     Objective:   Physical Exam   BP 132/76 mmHg  Pulse 72  Temp(Src) 98.3 F (36.8 C) (Oral)  Wt 373 lb (169.192 kg)  SpO2 98%  LMP 03/19/2015 Wt Readings from Last 3 Encounters:  04/12/15 373 lb (169.192 kg)  02/27/15 366 lb (166.017 kg)  10/12/14 359 lb (162.841 kg)    General: Appears her stated age, obese in NAD. Skin: Warm, dry and intact. No rashes, lesions or ulcerations noted. HEENT: Head: normal shape and size; Eyes: sclera white, no icterus, conjunctiva pink, PERRLA and EOMs intact;  Cardiovascular: Normal rate and rhythm. S1,S2 noted.  No murmur, rubs or gallops noted. No JVD or BLE edema. No carotid bruits noted. Pulmonary/Chest: Normal effort and positive vesicular breath sounds. No respiratory distress. No wheezes, rales or ronchi noted.  Abdomen: Soft and nontender.  Neurological: Alert and oriented.  Psychiatric: She does engage and make eye contact. Her affect is mildly flat. She is a little tearful today.  BMET    Component Value Date/Time   NA 134* 10/12/2014 1003   NA 135* 10/17/2012 2223   K 4.0 10/12/2014 1003   K 4.2 10/17/2012 2223   CL 102 10/12/2014 1003   CL 100 10/17/2012 2223   CO2 25 10/12/2014 1003   CO2 25 10/17/2012 2223   GLUCOSE 174* 10/12/2014 1003   GLUCOSE 383* 10/17/2012 2223   BUN 13 10/12/2014 1003   BUN 7 10/17/2012 2223   CREATININE 0.74 10/12/2014 1003   CREATININE 0.77 10/17/2012 2223   CREATININE 0.78 06/03/2007 1626   CALCIUM 9.2 10/12/2014 1003    CALCIUM 8.8 10/17/2012 2223   GFRNONAA >90 02/26/2014 1253   GFRNONAA >60 10/17/2012 2223   GFRAA >90 02/26/2014 1253   GFRAA >60 10/17/2012 2223    Lipid Panel  No results found for: CHOL, TRIG, HDL, CHOLHDL, VLDL, LDLCALC  CBC    Component Value Date/Time   WBC 7.7 02/26/2014 1253   WBC 10.7 10/17/2012 2223   RBC 4.46 02/26/2014 1253   RBC 5.10 10/17/2012 2223   HGB 11.2* 02/26/2014 1253   HGB 12.9 10/17/2012 2223   HCT 35.4* 02/26/2014 1253   HCT 39.5 10/17/2012 2223   PLT 221 02/26/2014 1253   PLT 234 10/17/2012 2223   MCV 79.4 02/26/2014 1253   MCV  77* 10/17/2012 2223   MCH 25.1* 02/26/2014 1253   MCH 25.2* 10/17/2012 2223   MCHC 31.6 02/26/2014 1253   MCHC 32.6 10/17/2012 2223   RDW 14.3 02/26/2014 1253   RDW 15.0* 10/17/2012 2223    Hgb A1C Lab Results  Component Value Date   HGBA1C 7.7* 10/12/2014        Assessment & Plan:

## 2015-04-12 NOTE — Patient Instructions (Signed)

## 2015-04-12 NOTE — Assessment & Plan Note (Signed)
Will try decreasing her Protonix to 20 mg daily She will cut her tablets in half and let me know how she is doing in 2 weeks CBC and CMET today

## 2015-04-12 NOTE — Telephone Encounter (Signed)
Refill for 30 days, pt due for appt

## 2015-04-12 NOTE — Assessment & Plan Note (Signed)
Nonadherent with diet, exercise and medication regimen Will check A1C and microalbumin today Will check Lipid profile today Flu and Pneumovax given today Encouraged her to get eye exams yearly Foot exam today She will continue Metformin and Amaryl as prescribed for now

## 2015-04-12 NOTE — Addendum Note (Signed)
Addended by: Roena MaladyEVONTENNO, Aveya Beal Y on: 04/12/2015 10:35 AM   Modules accepted: Orders

## 2015-04-12 NOTE — Telephone Encounter (Signed)
Pt has appt today sent in 1 refill

## 2015-04-12 NOTE — Assessment & Plan Note (Signed)
Sounds like tension headaches Will see if this improves with treatment of stress and anxiety If persist, consider further workup Continue Ibuprofen as needed

## 2015-04-12 NOTE — Addendum Note (Signed)
Addended by: Liane ComberHAVERS, Yasmin Bronaugh C on: 04/12/2015 10:42 AM   Modules accepted: Orders, SmartSet

## 2015-04-13 ENCOUNTER — Ambulatory Visit: Payer: 59 | Admitting: Internal Medicine

## 2015-04-13 MED ORDER — ONETOUCH ULTRA 2 W/DEVICE KIT: 1.0000 | PACK | Freq: Once | Status: AC

## 2015-04-13 MED ORDER — GLUCOSE BLOOD VI STRP: 1.0000 | ORAL_STRIP | Freq: Two times a day (BID) | Status: AC

## 2015-04-13 MED ORDER — ONETOUCH ULTRASOFT LANCETS MISC
1.0000 | Freq: Two times a day (BID) | Status: AC
Start: 2015-04-13 — End: ?

## 2015-04-13 NOTE — Addendum Note (Signed)
Addended by: Roena MaladyEVONTENNO, Julyanna Scholle Y on: 04/13/2015 09:23 AM   Modules accepted: Orders

## 2015-06-07 ENCOUNTER — Other Ambulatory Visit: Payer: Self-pay | Admitting: Internal Medicine

## 2015-06-19 ENCOUNTER — Emergency Department (HOSPITAL_COMMUNITY)
Admission: EM | Admit: 2015-06-19 | Discharge: 2015-06-19 | Disposition: A | Discharge status: Home / Self Care | Payer: 59 | Attending: Emergency Medicine | Admitting: Emergency Medicine

## 2015-06-19 ENCOUNTER — Encounter (HOSPITAL_COMMUNITY): Payer: Self-pay | Admitting: Emergency Medicine

## 2015-06-19 DIAGNOSIS — K029 Dental caries, unspecified: Secondary | ICD-10-CM | POA: Insufficient documentation

## 2015-06-19 DIAGNOSIS — Z79899 Other long term (current) drug therapy: Secondary | ICD-10-CM | POA: Insufficient documentation

## 2015-06-19 DIAGNOSIS — K219 Gastro-esophageal reflux disease without esophagitis: Secondary | ICD-10-CM | POA: Diagnosis not present

## 2015-06-19 DIAGNOSIS — Z8619 Personal history of other infectious and parasitic diseases: Secondary | ICD-10-CM | POA: Insufficient documentation

## 2015-06-19 DIAGNOSIS — E119 Type 2 diabetes mellitus without complications: Secondary | ICD-10-CM | POA: Insufficient documentation

## 2015-06-19 DIAGNOSIS — R51 Headache: Secondary | ICD-10-CM | POA: Diagnosis not present

## 2015-06-19 DIAGNOSIS — F419 Anxiety disorder, unspecified: Secondary | ICD-10-CM | POA: Diagnosis not present

## 2015-06-19 DIAGNOSIS — K0889 Other specified disorders of teeth and supporting structures: Secondary | ICD-10-CM | POA: Diagnosis present

## 2015-06-19 DIAGNOSIS — K047 Periapical abscess without sinus: Secondary | ICD-10-CM

## 2015-06-19 HISTORY — DX: Anxiety disorder, unspecified: F41.9

## 2015-06-19 MED ORDER — TRAMADOL HCL 50 MG PO TABS: 50.0000 mg | ORAL_TABLET | Freq: Four times a day (QID) | ORAL | Status: DC | PRN

## 2015-06-19 MED ORDER — AMOXICILLIN 500 MG PO CAPS: 500.0000 mg | ORAL_CAPSULE | Freq: Three times a day (TID) | ORAL | Status: DC

## 2015-06-19 NOTE — ED Provider Notes (Signed)
CSN: [id number]     Arrival date & time 06/19/15  9758 History   First MD Initiated Contact with Patient 06/19/15 1048     Chief Complaint  Patient presents with  . Dental Pain     (Consider location/radiation/quality/duration/timing/severity/associated sxs/prior Treatment) Patient is a 34 y.o. female presenting with tooth pain. The history is provided by the patient.  Dental Pain Location:  Upper and lower Quality:  Throbbing Severity:  Moderate Onset quality:  Gradual Duration:  3 days Timing:  Intermittent Progression:  Worsening Context: dental caries and poor dentition   Relieved by:  Nothing Ineffective treatments:  Acetaminophen Associated symptoms: facial pain, gum swelling and headaches   Associated symptoms: no difficulty swallowing and no drooling   Risk factors: diabetes and lack of dental care   Risk factors: no smoking     Past Medical History  Diagnosis Date  . Diabetes mellitus without complication ([place])   . Chicken pox   . GERD (gastroesophageal reflux disease)   . Frequent headaches   . Allergy   . Hyperlipidemia   . Anxiety    Past Surgical History  Procedure Laterality Date  . Tonsillectomy    . Cesarean section     Family History  Problem Relation Age of Onset  . Arthritis Mother   . Hyperlipidemia Mother   . Hypertension Mother   . Diabetes Mother   . Arthritis Father   . Cancer Father     prostate  . Hyperlipidemia Father   . Hypertension Father   . Diabetes Father   . Diabetes Sister   . Arthritis Maternal Grandmother   . Diabetes Maternal Grandmother   . Arthritis Maternal Grandfather   . Diabetes Maternal Grandfather   . Arthritis Paternal Grandmother   . Hypertension Paternal Grandmother   . Diabetes Paternal Grandmother   . Arthritis Paternal Grandfather   . Stroke Paternal Grandfather   . Diabetes Paternal Grandfather    Social History  Substance Use Topics  . Smoking status: Never Smoker   . Smokeless tobacco: Never  Used  . Alcohol Use: No   OB History    No data available     Review of Systems  HENT: Positive for dental problem. Negative for drooling.   Neurological: Positive for headaches.  Psychiatric/Behavioral: The patient is nervous/anxious.   All other systems reviewed and are negative.     Allergies  Review of patient's allergies indicates no known allergies.  Home Medications   Prior to Admission medications   Medication Sig Start Date End Date Taking? Authorizing Provider  ALPRAZolam Duanne Moron) 0.5 MG tablet Take 1 tablet (0.5 mg total) by mouth daily as needed for anxiety. 04/12/15   Jearld Fenton, NP  Blood Glucose Monitoring Suppl (ONE TOUCH ULTRA 2) W/DEVICE KIT 1 Device by Does not apply route one time only at 6 PM. 04/13/15   Jearld Fenton, NP  citalopram (CELEXA) 20 MG tablet Take 1 tablet (20 mg total) by mouth daily. 04/12/15   Jearld Fenton, NP  glimepiride (AMARYL) 4 MG tablet TAKE 1/2 TABLET BY MOUTH EVERY DAY. 06/08/15   Jearld Fenton, NP  glucose blood (ONE TOUCH TEST STRIPS) test strip 1 each by Other route 2 (two) times daily. Use as instructed 04/13/15   Jearld Fenton, NP  ibuprofen (ADVIL,MOTRIN) 200 MG tablet Take 800 mg by mouth every 6 (six) hours as needed for moderate pain.    Historical Provider, MD  Lancets [facility] ULTRASOFT) lancets 1 each  by Other route 2 (two) times daily. Use as instructed 04/13/15   Jearld Fenton, NP  metFORMIN (GLUCOPHAGE) 1000 MG tablet 1 tablet in morning, 1 1/2 tablet at night 03/14/15   Jearld Fenton, NP  pantoprazole (PROTONIX) 40 MG tablet Take 1 tablet (40 mg total) by mouth daily. 04/12/15   Jearld Fenton, NP  Prenatal MV-Min-Fe Fum-FA-DHA (PRENATAL 1 PO) Take 1 capsule by mouth daily.    Historical Provider, MD   BP 133/75 mmHg  Pulse 74  Temp(Src) 98.4 F (36.9 C) (Oral)  Resp 16  SpO2 95% Physical Exam  Constitutional: She is oriented to person, place, and time. She appears well-developed and well-nourished.   Non-toxic appearance.  HENT:  Head: Normocephalic.  Right Ear: Tympanic membrane and external ear normal.  Left Ear: Tympanic membrane and external ear normal.  Deep dental caries of the right upper and lower posterior molars. Some gum swelling, but no visible abscess. Airway patent. No swelling under the tongue.  Eyes: EOM and lids are normal. Pupils are equal, round, and reactive to light.  Neck: Normal range of motion. Neck supple. Carotid bruit is not present.  Cardiovascular: Normal rate, regular rhythm, normal heart sounds, intact distal pulses and normal pulses.   Pulmonary/Chest: Breath sounds normal. No respiratory distress.  Abdominal: Soft. Bowel sounds are normal. There is no tenderness. There is no guarding.  Musculoskeletal: Normal range of motion.  Lymphadenopathy:       Head (right side): No submandibular adenopathy present.       Head (left side): No submandibular adenopathy present.    She has no cervical adenopathy.  Neurological: She is alert and oriented to person, place, and time. She has normal strength. No cranial nerve deficit or sensory deficit.  Skin: Skin is warm and dry.  Psychiatric: She has a normal mood and affect. Her speech is normal.  Nursing note and vitals reviewed.   ED Course  Procedures (including critical care time) Labs Review Labs Reviewed - No data to display  Imaging Review No results found. I have personally reviewed and evaluated these images and lab results as part of my medical decision-making.   EKG Interpretation None      MDM  Vital signs reviewed. Pt has deep dental caries of the right upper and lower molar area. No evidence for Ludwig's angina. No Trismus. Rx for amoxil and ultram given to the patient. Pt strongly encouraged to see a dentist as soon as possible an to monitor blood glucose closely.   Final diagnoses:  None    *I have reviewed nursing notes, vital signs, and all appropriate lab and imaging results for  this patient.Lily Kocher, PA-C 06/19/15 1103  Milton Ferguson, MD 06/21/15 1030

## 2015-06-19 NOTE — Progress Notes (Addendum)
Pt c/o right sided dental pain /jaw pain times two weeks. Pain is a 10/10. Pt stated she has been taken tylenol w/o relief. Family is at the bedside. Pt seen by PA (10:50am)

## 2015-06-19 NOTE — Discharge Instructions (Signed)
Please monitor your blood glucose closely. Please see your dentist as soon as possible. Use amoxil three times daily. Use tylenol or ibuprofen for mild pain. Use Ultram for more severe pain.This mediction may cause drowsiness,use with caution. Dental Caries Dental caries (also called tooth decay) is the most common oral disease. It can occur at any age but is more common in children and young adults.  HOW DENTAL CARIES DEVELOPS  The process of decay begins when bacteria and foods (particularly sugars and starches) combine in your mouth to produce plaque. Plaque is a substance that sticks to the hard, outer surface of a tooth (enamel). The bacteria in plaque produce acids that attack enamel. These acids may also attack the root surface of a tooth (cementum) if it is exposed. Repeated attacks dissolve these surfaces and create holes in the tooth (cavities). If left untreated, the acids destroy the other layers of the tooth.  RISK FACTORS  Frequent sipping of sugary beverages.   Frequent snacking on sugary and starchy foods, especially those that easily get stuck in the teeth.   Poor oral hygiene.   Dry mouth.   Substance abuse such as methamphetamine abuse.   Broken or poor-fitting dental restorations.   Eating disorders.   Gastroesophageal reflux disease (GERD).   Certain radiation treatments to the head and neck. SYMPTOMS In the early stages of dental caries, symptoms are seldom present. Sometimes white, chalky areas may be seen on the enamel or other tooth layers. In later stages, symptoms may include:  Pits and holes on the enamel.  Toothache after sweet, hot, or cold foods or drinks are consumed.  Pain around the tooth.  Swelling around the tooth. DIAGNOSIS  Most of the time, dental caries is detected during a regular dental checkup. A diagnosis is made after a thorough medical and dental history is taken and the surfaces of your teeth are checked for signs of dental  caries. Sometimes special instruments, such as lasers, are used to check for dental caries. Dental X-ray exams may be taken so that areas not visible to the eye (such as between the contact areas of the teeth) can be checked for cavities.  TREATMENT  If dental caries is in its early stages, it may be reversed with a fluoride treatment or an application of a remineralizing agent at the dental office. Thorough brushing and flossing at home is needed to aid these treatments. If it is in its later stages, treatment depends on the location and extent of tooth destruction:   If a small area of the tooth has been destroyed, the destroyed area will be removed and cavities will be filled with a material such as gold, silver amalgam, or composite resin.   If a large area of the tooth has been destroyed, the destroyed area will be removed and a cap (crown) will be fitted over the remaining tooth structure.   If the center part of the tooth (pulp) is affected, a procedure called a root canal will be needed before a filling or crown can be placed.   If most of the tooth has been destroyed, the tooth may need to be pulled (extracted). HOME CARE INSTRUCTIONS You can prevent, stop, or reverse dental caries at home by practicing good oral hygiene. Good oral hygiene includes:  Thoroughly cleaning your teeth at least twice a day with a toothbrush and dental floss.   Using a fluoride toothpaste. A fluoride mouth rinse may also be used if recommended by your dentist or  health care provider.   Restricting the amount of sugary and starchy foods and sugary liquids you consume.   Avoiding frequent snacking on these foods and sipping of these liquids.   Keeping regular visits with a dentist for checkups and cleanings. PREVENTION   Practice good oral hygiene.  Consider a dental sealant. A dental sealant is a coating material that is applied by your dentist to the pits and grooves of teeth. The sealant  prevents food from being trapped in them. It may protect the teeth for several years.  Ask about fluoride supplements if you live in a community without fluorinated water or with water that has a low fluoride content. Use fluoride supplements as directed by your dentist or health care provider.  Allow fluoride varnish applications to teeth if directed by your dentist or health care provider.   This information is not intended to replace advice given to you by your health care provider. Make sure you discuss any questions you have with your health care provider.   Document Released: 03/01/2002 Document Revised: 06/30/2014 Document Reviewed: 06/11/2012 Elsevier Interactive Patient Education [employer]

## 2015-06-19 NOTE — ED Notes (Signed)
Pt reports R jaw/ear pain for the past few days. Pt also has dental pain in upper and lower back molars on R side

## 2015-06-29 ENCOUNTER — Other Ambulatory Visit: Payer: Self-pay | Admitting: Internal Medicine

## 2015-08-09 ENCOUNTER — Other Ambulatory Visit: Payer: Self-pay | Admitting: Internal Medicine

## 2015-08-29 ENCOUNTER — Encounter: Payer: Self-pay | Admitting: Internal Medicine

## 2015-08-29 ENCOUNTER — Ambulatory Visit (INDEPENDENT_AMBULATORY_CARE_PROVIDER_SITE_OTHER): Payer: BLUE CROSS/BLUE SHIELD | Admitting: Internal Medicine

## 2015-08-29 VITALS — BP 128/80 | HR 86 | Temp 98.7°F | Wt 366.0 lb

## 2015-08-29 DIAGNOSIS — J309 Allergic rhinitis, unspecified: Secondary | ICD-10-CM | POA: Diagnosis not present

## 2015-08-29 DIAGNOSIS — J069 Acute upper respiratory infection, unspecified: Secondary | ICD-10-CM | POA: Diagnosis not present

## 2015-08-29 DIAGNOSIS — J029 Acute pharyngitis, unspecified: Secondary | ICD-10-CM | POA: Diagnosis not present

## 2015-08-29 LAB — POCT RAPID STREP A (OFFICE): RAPID STREP A SCREEN: NEGATIVE

## 2015-08-29 NOTE — Progress Notes (Signed)
Subjective:    Patient ID: Deborah Francis, female    DOB: 03/07/1981, 35 y.o.   MRN: [id number]  HPI  Pt presents to the clinic today with c/o runny nose, sore throat, cough. This started 2 days ago. She has had some difficulty swallowing. She is not blowing anything out of her nose. The cough is productive of yellow mucous. She denies fever but has had chills and nausea. She has not tried anything OTC. Her son was diagnosed with strep 1 week ago. She has a history of seasonal allergies but denies breathing problems. She did get her flu shot.  Review of Systems      Past Medical History  Diagnosis Date  . Diabetes mellitus without complication ([place])   . Chicken pox   . GERD (gastroesophageal reflux disease)   . Frequent headaches   . Allergy   . Hyperlipidemia   . Anxiety     Current Outpatient Prescriptions  Medication Sig Dispense Refill  . ALPRAZolam (XANAX) 0.5 MG tablet Take 1 tablet (0.5 mg total) by mouth daily as needed for anxiety. 20 tablet 0  . Blood Glucose Monitoring Suppl (ONE TOUCH ULTRA 2) W/DEVICE KIT 1 Device by Does not apply route one time only at 6 PM. 1 each 0  . citalopram (CELEXA) 20 MG tablet TAKE 1 TABLET BY MOUTH DAILY 30 tablet 2  . glimepiride (AMARYL) 4 MG tablet TAKE 1/2 TABLET BY MOUTH EVERY DAY. 30 tablet 4  . glucose blood (ONE TOUCH TEST STRIPS) test strip 1 each by Other route 2 (two) times daily. Use as instructed 200 each 11  . ibuprofen (ADVIL,MOTRIN) 200 MG tablet Take 800 mg by mouth every 6 (six) hours as needed for moderate pain.    . Lancets (ONETOUCH ULTRASOFT) lancets 1 each by Other route 2 (two) times daily. Use as instructed 200 each 11  . metFORMIN (GLUCOPHAGE) 1000 MG tablet TAKE 1 TABLET BY MOUTH IN THE MORNING AND 1 & 1/2 TABLET AT NIGHT 75 tablet 4  . pantoprazole (PROTONIX) 40 MG tablet Take 1 tablet (40 mg total) by mouth daily. 30 tablet 5  . Prenatal MV-Min-Fe Fum-FA-DHA (PRENATAL 1 PO) Take 1 capsule by mouth daily.    .  traMADol (ULTRAM) 50 MG tablet Take 1 tablet (50 mg total) by mouth every 6 (six) hours as needed. 15 tablet 0   No current facility-administered medications for this visit.    No Known Allergies  Family History  Problem Relation Age of Onset  . Arthritis Mother   . Hyperlipidemia Mother   . Hypertension Mother   . Diabetes Mother   . Arthritis Father   . Cancer Father     prostate  . Hyperlipidemia Father   . Hypertension Father   . Diabetes Father   . Diabetes Sister   . Arthritis Maternal Grandmother   . Diabetes Maternal Grandmother   . Arthritis Maternal Grandfather   . Diabetes Maternal Grandfather   . Arthritis Paternal Grandmother   . Hypertension Paternal Grandmother   . Diabetes Paternal Grandmother   . Arthritis Paternal Grandfather   . Stroke Paternal Grandfather   . Diabetes Paternal Grandfather     Social History   Social History  . Marital Status: Married    Spouse Name: N/A  . Number of Children: N/A  . Years of Education: N/A   Occupational History  . Not on file.   Social History Main Topics  . Smoking status: Never Smoker   .  Smokeless tobacco: Never Used  . Alcohol Use: No  . Drug Use: No  . Sexual Activity: Yes   Other Topics Concern  . Not on file   Social History Narrative     Constitutional: Denies fever, malaise, fatigue, headache or abrupt weight changes.  HEENT: Pt reports runny nose, sore throat. Denies eye pain, eye redness, ear pain, ringing in the ears, wax buildup, nasal congestion, bloody nose. Respiratory: Pt reports cough. Denies difficulty breathing, shortness of breath, or sputum production.   Cardiovascular: Denies chest pain, chest tightness, palpitations or swelling in the hands or feet.  Gastrointestinal: Pt reports nausea. Denies abdominal pain, bloating, constipation, diarrhea or blood in the stool.  Skin: Denies redness, rashes, lesions or ulcercations.    No other specific complaints in a complete review of  systems (except as listed in HPI above).  Objective:   Physical Exam  BP 128/80 mmHg  Pulse 86  Temp(Src) 98.7 F (37.1 C) (Oral)  Wt 366 lb (166.017 kg)  SpO2 98%  LMP 08/24/2015 Wt Readings from Last 3 Encounters:  08/29/15 366 lb (166.017 kg)  04/12/15 373 lb (169.192 kg)  02/27/15 366 lb (166.017 kg)    General: Appears her stated age, obese in NAD. Skin: Warm, dry and intact. No rashes noted. HEENT: Head: normal shape and size, no sinus tenderness noted; Eyes: sclera white, no icterus, conjunctiva pink; Ears: bilateral cerumen impaction; Throat/Mouth: Teeth present, mucosa pink and moist, no exudate, lesions or ulcerations noted.  Neck:  No adenopathy noted. Cardiovascular: Normal rate and rhythm. S1,S2 noted.  No murmur, rubs or gallops noted.  Pulmonary/Chest: Normal effort and positive vesicular breath sounds. No respiratory distress. No wheezes, rales or ronchi noted.    BMET    Component Value Date/Time   NA 135 04/12/2015 0946   NA 135* 10/17/2012 2223   K 4.4 04/12/2015 0946   K 4.2 10/17/2012 2223   CL 101 04/12/2015 0946   CL 100 10/17/2012 2223   CO2 26 04/12/2015 0946   CO2 25 10/17/2012 2223   GLUCOSE 204* 04/12/2015 0946   GLUCOSE 383* 10/17/2012 2223   BUN 15 04/12/2015 0946   BUN 7 10/17/2012 2223   CREATININE 0.76 04/12/2015 0946   CREATININE 0.77 10/17/2012 2223   CREATININE 0.78 06/03/2007 1626   CALCIUM 9.0 04/12/2015 0946   CALCIUM 8.8 10/17/2012 2223   GFRNONAA >90 02/26/2014 1253   GFRNONAA >60 10/17/2012 2223   GFRAA >90 02/26/2014 1253   GFRAA >60 10/17/2012 2223    Lipid Panel     Component Value Date/Time   CHOL 149 04/12/2015 0946   TRIG 118.0 04/12/2015 0946   HDL 49.80 04/12/2015 0946   CHOLHDL 3 04/12/2015 0946   VLDL 23.6 04/12/2015 0946   LDLCALC 76 04/12/2015 0946    CBC    Component Value Date/Time   WBC 9.4 04/12/2015 0946   WBC 10.7 10/17/2012 2223   RBC 4.68 04/12/2015 0946   RBC 5.10 10/17/2012 2223   HGB  11.2* 04/12/2015 0946   HGB 12.9 10/17/2012 2223   HCT 35.1* 04/12/2015 0946   HCT 39.5 10/17/2012 2223   PLT 246.0 04/12/2015 0946   PLT 234 10/17/2012 2223   MCV 75.0* 04/12/2015 0946   MCV 77* 10/17/2012 2223   MCH 25.1* 02/26/2014 1253   MCH 25.2* 10/17/2012 2223   MCHC 32.0 04/12/2015 0946   MCHC 32.6 10/17/2012 2223   RDW 15.7* 04/12/2015 0946   RDW 15.0* 10/17/2012 2223    Hgb  A1C Lab Results  Component Value Date   HGBA1C 8.2* 04/12/2015         Assessment & Plan:   Allergic Rhinitis vs viral URI:  Rapid strep: negative Get some rest and drink plenty of fluids Ibuprofen as needed for sore throat/body aches Zyrtec and Flonase as needed for allergy symptoms  RTC as needed or if symptoms persist or worsen

## 2015-08-29 NOTE — Progress Notes (Signed)
Pre visit review using our clinic review tool, if applicable. No additional management support is needed unless otherwise documented below in the visit note. 

## 2015-08-29 NOTE — Addendum Note (Signed)
Addended by: Roena MaladyEVONTENNO, Chesky Heyer Y on: 08/29/2015 03:36 PM   Modules accepted: Orders

## 2015-08-29 NOTE — Patient Instructions (Signed)
Allergic Rhinitis Allergic rhinitis is when the mucous membranes in the nose respond to allergens. Allergens are particles in the air that cause your body to have an allergic reaction. This causes you to release allergic antibodies. Through a chain of events, these eventually cause you to release histamine into the blood stream. Although meant to protect the body, it is this release of histamine that causes your discomfort, such as frequent sneezing, congestion, and an itchy, runny nose.  CAUSES Seasonal allergic rhinitis (hay fever) is caused by pollen allergens that may come from grasses, trees, and weeds. Year-round allergic rhinitis (perennial allergic rhinitis) is caused by allergens such as house dust mites, pet dander, and mold spores. SYMPTOMS  Nasal stuffiness (congestion).  Itchy, runny nose with sneezing and tearing of the eyes. DIAGNOSIS Your health care provider can help you determine the allergen or allergens that trigger your symptoms. If you and your health care provider are unable to determine the allergen, skin or blood testing may be used. Your health care provider will diagnose your condition after taking your health history and performing a physical exam. Your health care provider may assess you for other related conditions, such as asthma, pink eye, or an ear infection. TREATMENT Allergic rhinitis does not have a cure, but it can be controlled by:  Medicines that block allergy symptoms. These may include allergy shots, nasal sprays, and oral antihistamines.  Avoiding the allergen. Hay fever may often be treated with antihistamines in pill or nasal spray forms. Antihistamines block the effects of histamine. There are over-the-counter medicines that may help with nasal congestion and swelling around the eyes. Check with your health care provider before taking or giving this medicine. If avoiding the allergen or the medicine prescribed do not work, there are many new medicines  your health care provider can prescribe. Stronger medicine may be used if initial measures are ineffective. Desensitizing injections can be used if medicine and avoidance does not work. Desensitization is when a patient is given ongoing shots until the body becomes less sensitive to the allergen. Make sure you follow up with your health care provider if problems continue. HOME CARE INSTRUCTIONS It is not possible to completely avoid allergens, but you can reduce your symptoms by taking steps to limit your exposure to them. It helps to know exactly what you are allergic to so that you can avoid your specific triggers. SEEK MEDICAL CARE IF:  You have a fever.  You develop a cough that does not stop easily (persistent).  You have shortness of breath.  You start wheezing.  Symptoms interfere with normal daily activities.   This information is not intended to replace advice given to you by your health care provider. Make sure you discuss any questions you have with your health care provider.   Document Released: 03/04/2001 Document Revised: 06/30/2014 Document Reviewed: 02/14/2013 [employer] Interactive Patient Education 2016 [employer]  

## 2015-10-15 ENCOUNTER — Ambulatory Visit (INDEPENDENT_AMBULATORY_CARE_PROVIDER_SITE_OTHER): Payer: BLUE CROSS/BLUE SHIELD | Admitting: Internal Medicine

## 2015-10-15 ENCOUNTER — Encounter: Payer: Self-pay | Admitting: Internal Medicine

## 2015-10-15 VITALS — BP 122/78 | HR 76 | Temp 99.0°F | Ht 68.25 in | Wt 366.0 lb

## 2015-10-15 DIAGNOSIS — G43009 Migraine without aura, not intractable, without status migrainosus: Secondary | ICD-10-CM | POA: Diagnosis not present

## 2015-10-15 DIAGNOSIS — E1141 Type 2 diabetes mellitus with diabetic mononeuropathy: Secondary | ICD-10-CM | POA: Diagnosis not present

## 2015-10-15 DIAGNOSIS — R519 Headache, unspecified: Secondary | ICD-10-CM

## 2015-10-15 DIAGNOSIS — J302 Other seasonal allergic rhinitis: Secondary | ICD-10-CM | POA: Diagnosis not present

## 2015-10-15 DIAGNOSIS — M5441 Lumbago with sciatica, right side: Secondary | ICD-10-CM | POA: Diagnosis not present

## 2015-10-15 DIAGNOSIS — IMO0001 Reserved for inherently not codable concepts without codable children: Secondary | ICD-10-CM

## 2015-10-15 DIAGNOSIS — S29011A Strain of muscle and tendon of front wall of thorax, initial encounter: Secondary | ICD-10-CM

## 2015-10-15 DIAGNOSIS — R51 Headache: Secondary | ICD-10-CM

## 2015-10-15 DIAGNOSIS — F411 Generalized anxiety disorder: Secondary | ICD-10-CM | POA: Diagnosis not present

## 2015-10-15 DIAGNOSIS — E1165 Type 2 diabetes mellitus with hyperglycemia: Secondary | ICD-10-CM | POA: Diagnosis not present

## 2015-10-15 DIAGNOSIS — Z0001 Encounter for general adult medical examination with abnormal findings: Secondary | ICD-10-CM | POA: Diagnosis not present

## 2015-10-15 DIAGNOSIS — K219 Gastro-esophageal reflux disease without esophagitis: Secondary | ICD-10-CM

## 2015-10-15 DIAGNOSIS — E785 Hyperlipidemia, unspecified: Secondary | ICD-10-CM

## 2015-10-15 LAB — CBC
HCT: 34.1 % — ABNORMAL LOW (ref 36.0–46.0)
Hemoglobin: 10.9 g/dL — ABNORMAL LOW (ref 12.0–15.0)
MCHC: 31.8 g/dL (ref 30.0–36.0)
MCV: 72.1 fl — ABNORMAL LOW (ref 78.0–100.0)
PLATELETS: 262 10*3/uL (ref 150.0–400.0)
RBC: 4.74 Mil/uL (ref 3.87–5.11)
RDW: 16.9 % — ABNORMAL HIGH (ref 11.5–15.5)
WBC: 9.4 10*3/uL (ref 4.0–10.5)

## 2015-10-15 LAB — LIPID PANEL
CHOL/HDL RATIO: 4
CHOLESTEROL: 168 mg/dL (ref 0–200)
HDL: 45.7 mg/dL (ref >39.00)
LDL CALC: 98 mg/dL (ref 0–99)
NonHDL: 122.76
TRIGLYCERIDES: 125 mg/dL (ref 0.0–149.0)
VLDL: 25 mg/dL (ref 0.0–40.0)

## 2015-10-15 LAB — COMPREHENSIVE METABOLIC PANEL
ALBUMIN: 3.9 g/dL (ref 3.5–5.2)
ALK PHOS: 77 U/L (ref 39–117)
ALT: 11 U/L (ref 0–35)
AST: 8 U/L (ref 0–37)
BILIRUBIN TOTAL: 0.3 mg/dL (ref 0.2–1.2)
BUN: 11 mg/dL (ref 6–23)
CALCIUM: 9.2 mg/dL (ref 8.4–10.5)
CO2: 25 mEq/L (ref 19–32)
CREATININE: 0.8 mg/dL (ref 0.40–1.20)
Chloride: 102 mEq/L (ref 96–112)
GFR: 87.98 mL/min (ref >60.00)
Glucose, Bld: 177 mg/dL — ABNORMAL HIGH (ref 70–99)
Potassium: 4.5 mEq/L (ref 3.5–5.1)
Sodium: 136 mEq/L (ref 135–145)
TOTAL PROTEIN: 7.2 g/dL (ref 6.0–8.3)

## 2015-10-15 LAB — HEMOGLOBIN A1C: HEMOGLOBIN A1C: 7.7 % — AB (ref 4.6–6.5)

## 2015-10-15 MED ORDER — CITALOPRAM HYDROBROMIDE 40 MG PO TABS: 40.0000 mg | ORAL_TABLET | Freq: Every day | ORAL | Status: DC

## 2015-10-15 MED ORDER — TOPIRAMATE 25 MG PO TABS: 25.0000 mg | ORAL_TABLET | Freq: Every day | ORAL | Status: DC

## 2015-10-15 MED ORDER — NAPROXEN 500 MG PO TABS: 500.0000 mg | ORAL_TABLET | Freq: Two times a day (BID) | ORAL | Status: DC

## 2015-10-15 NOTE — Patient Instructions (Addendum)
Health Maintenance, Female Adopting a healthy lifestyle and getting preventive care can go a long way to promote health and wellness. Talk with your health care provider about what schedule of regular examinations is right for you. This is a good chance for you to check in with your provider about disease prevention and staying healthy. In between checkups, there are plenty of things you can do on your own. Experts have done a lot of research about which lifestyle changes and preventive measures are most likely to keep you healthy. Ask your health care provider for more information. WEIGHT AND DIET  Eat a healthy diet 1. Be sure to include plenty of vegetables, fruits, low-fat dairy products, and lean protein. 2. Do not eat a lot of foods high in solid fats, added sugars, or salt. 3. Get regular exercise. This is one of the most important things you can do for your health. 1. Most adults should exercise for at least 150 minutes each week. The exercise should increase your heart rate and make you sweat (moderate-intensity exercise). 2. Most adults should also do strengthening exercises at least twice a week. This is in addition to the moderate-intensity exercise.  Maintain a healthy weight 1. Body mass index (BMI) is a measurement that can be used to identify possible weight problems. It estimates body fat based on height and weight. Your health care provider can help determine your BMI and help you achieve or maintain a healthy weight. 2. For females 59 years of age and older:  1. A BMI below 18.5 is considered underweight. 2. A BMI of 18.5 to 24.9 is normal. 3. A BMI of 25 to 29.9 is considered overweight. 4. A BMI of 30 and above is considered obese.  Watch levels of cholesterol and blood lipids 1. You should start having your blood tested for lipids and cholesterol at 33 years of age, then have this test every 5 years. 2. You may need to have your cholesterol levels checked more often  if: 1. Your lipid or cholesterol levels are high. 2. You are older than 33 years of age. 3. You are at high risk for heart disease.  CANCER SCREENING   Lung Cancer 1. Lung cancer screening is recommended for adults 83-80 years old who are at high risk for lung cancer because of a history of smoking. 2. A yearly low-dose CT scan of the lungs is recommended for people who: 1. Currently smoke. 2. Have quit within the past 15 years. 3. Have at least a 30-pack-year history of smoking. A pack year is smoking an average of one pack of cigarettes a day for 1 year. 3. Yearly screening should continue until it has been 15 years since you quit. 4. Yearly screening should stop if you develop a health problem that would prevent you from having lung cancer treatment.  Breast Cancer 1. Practice breast self-awareness. This means understanding how your breasts normally appear and feel. 2. It also means doing regular breast self-exams. Let your health care provider know about any changes, no matter how small. 3. If you are in your 20s or 30s, you should have a clinical breast exam (CBE) by a health care provider every 1-3 years as part of a regular health exam. 4. If you are 40 or older, have a CBE every year. Also consider having a breast X-ray (mammogram) every year. 5. If you have a family history of breast cancer, talk to your health care provider about genetic screening. 6. If you  are at high risk for breast cancer, talk to your health care provider about having an MRI and a mammogram every year. 7. Breast cancer gene (BRCA) assessment is recommended for women who have family members with BRCA-related cancers. BRCA-related cancers include: 1. Breast. 2. Ovarian. 3. Tubal. 4. Peritoneal cancers. 8. Results of the assessment will determine the need for genetic counseling and BRCA1 and BRCA2 testing. Cervical Cancer Your health care provider may recommend that you be screened regularly for cancer of  the pelvic organs (ovaries, uterus, and vagina). This screening involves a pelvic examination, including checking for microscopic changes to the surface of your cervix (Pap test). You may be encouraged to have this screening done every 3 years, beginning at age 44. 77. For women ages 55-65, health care providers may recommend pelvic exams and Pap testing every 3 years, or they may recommend the Pap and pelvic exam, combined with testing for human papilloma virus (HPV), every 5 years. Some types of HPV increase your risk of cervical cancer. Testing for HPV may also be done on women of any age with unclear Pap test results. 2. Other health care providers may not recommend any screening for nonpregnant women who are considered low risk for pelvic cancer and who do not have symptoms. Ask your health care provider if a screening pelvic exam is right for you. 3. If you have had past treatment for cervical cancer or a condition that could lead to cancer, you need Pap tests and screening for cancer for at least 20 years after your treatment. If Pap tests have been discontinued, your risk factors (such as having a new sexual partner) need to be reassessed to determine if screening should resume. Some women have medical problems that increase the chance of getting cervical cancer. In these cases, your health care provider may recommend more frequent screening and Pap tests. Colorectal Cancer 1. This type of cancer can be detected and often prevented. 2. Routine colorectal cancer screening usually begins at 33 years of age and continues through 33 years of age. 3. Your health care provider may recommend screening at an earlier age if you have risk factors for colon cancer. 4. Your health care provider may also recommend using home test kits to check for hidden blood in the stool. 5. A small camera at the end of a tube can be used to examine your colon directly (sigmoidoscopy or colonoscopy). This is done to check for  the earliest forms of colorectal cancer. 6. Routine screening usually begins at age 14. 56. Direct examination of the colon should be repeated every 5-10 years through 33 years of age. However, you may need to be screened more often if early forms of precancerous polyps or small growths are found. Skin Cancer  Check your skin from head to toe regularly.  Tell your health care provider about any new moles or changes in moles, especially if there is a change in a mole's shape or color.  Also tell your health care provider if you have a mole that is larger than the size of a pencil eraser.  Always use sunscreen. Apply sunscreen liberally and repeatedly throughout the day.  Protect yourself by wearing long sleeves, pants, a wide-brimmed hat, and sunglasses whenever you are outside. HEART DISEASE, DIABETES, AND HIGH BLOOD PRESSURE   High blood pressure causes heart disease and increases the risk of stroke. High blood pressure is more likely to develop in:  People who have blood pressure in the high end  of the normal range (130-139/85-89 mm Hg).  People who are overweight or obese.  People who are African American.  If you are 48-28 years of age, have your blood pressure checked every 3-5 years. If you are 45 years of age or older, have your blood pressure checked every year. You should have your blood pressure measured twice--once when you are at a hospital or clinic, and once when you are not at a hospital or clinic. Record the average of the two measurements. To check your blood pressure when you are not at a hospital or clinic, you can use:  An automated blood pressure machine at a pharmacy.  A home blood pressure monitor.  If you are between 61 years and 31 years old, ask your health care provider if you should take aspirin to prevent strokes.  Have regular diabetes screenings. This involves taking a blood sample to check your fasting blood sugar level.  If you are at a normal weight  and have a low risk for diabetes, have this test once every three years after 33 years of age.  If you are overweight and have a high risk for diabetes, consider being tested at a younger age or more often. PREVENTING INFECTION  Hepatitis B  If you have a higher risk for hepatitis B, you should be screened for this virus. You are considered at high risk for hepatitis B if:  You were born in a country where hepatitis B is common. Ask your health care provider which countries are considered high risk.  Your parents were born in a high-risk country, and you have not been immunized against hepatitis B (hepatitis B vaccine).  You have HIV or AIDS.  You use needles to inject street drugs.  You live with someone who has hepatitis B.  You have had sex with someone who has hepatitis B.  You get hemodialysis treatment.  You take certain medicines for conditions, including cancer, organ transplantation, and autoimmune conditions. Hepatitis C  Blood testing is recommended for:  Everyone born from [birth year] through 1963.  Anyone with known risk factors for hepatitis C. Sexually transmitted infections (STIs)  You should be screened for sexually transmitted infections (STIs) including gonorrhea and chlamydia if:  You are sexually active and are younger than 33 years of age.  You are older than 33 years of age and your health care provider tells you that you are at risk for this type of infection.  Your sexual activity has changed since you were last screened and you are at an increased risk for chlamydia or gonorrhea. Ask your health care provider if you are at risk.  If you do not have HIV, but are at risk, it may be recommended that you take a prescription medicine daily to prevent HIV infection. This is called pre-exposure prophylaxis (PrEP). You are considered at risk if:  You are sexually active and do not regularly use condoms or know the HIV status of your partner(s).  You take drugs  by injection.  You are sexually active with a partner who has HIV. Talk with your health care provider about whether you are at high risk of being infected with HIV. If you choose to begin PrEP, you should first be tested for HIV. You should then be tested every 3 months for as long as you are taking PrEP.  PREGNANCY   If you are premenopausal and you may become pregnant, ask your health care provider about preconception counseling.  If you may  become pregnant, take 400 to 800 micrograms (mcg) of folic acid every day.  If you want to prevent pregnancy, talk to your health care provider about birth control (contraception). OSTEOPOROSIS AND MENOPAUSE   Osteoporosis is a disease in which the bones lose minerals and strength with aging. This can result in serious bone fractures. Your risk for osteoporosis can be identified using a bone density scan.  If you are 48 years of age or older, or if you are at risk for osteoporosis and fractures, ask your health care provider if you should be screened.  Ask your health care provider whether you should take a calcium or vitamin D supplement to lower your risk for osteoporosis.  Menopause may have certain physical symptoms and risks.  Hormone replacement therapy may reduce some of these symptoms and risks. Talk to your health care provider about whether hormone replacement therapy is right for you.  HOME CARE INSTRUCTIONS   Schedule regular health, dental, and eye exams.  Stay current with your immunizations.   Do not use any tobacco products including cigarettes, chewing tobacco, or electronic cigarettes.  If you are pregnant, do not drink alcohol.  If you are breastfeeding, limit how much and how often you drink alcohol.  Limit alcohol intake to no more than 1 drink per day for nonpregnant women. One drink equals 12 ounces of beer, 5 ounces of wine, or 1 ounces of hard liquor.  Do not use street drugs.  Do not share needles.  Ask your  health care provider for help if you need support or information about quitting drugs.  Tell your health care provider if you often feel depressed.  Tell your health care provider if you have ever been abused or do not feel safe at home.   This information is not intended to replace advice given to you by your health care provider. Make sure you discuss any questions you have with your health care provider.   Document Released: 12/23/2010 Document Revised: 06/30/2014 Document Reviewed: 05/11/2013 Elsevier Interactive Patient Education 2016 [place] Exercises The following exercises strengthen the muscles that help to support the back. They also help to keep the lower back flexible. Doing these exercises can help to prevent back pain or lessen existing pain. If you have back pain or discomfort, try doing these exercises 2-3 times each day or as told by your health care provider. When the pain goes away, do them once each day, but increase the number of times that you repeat the steps for each exercise (do more repetitions). If you do not have back pain or discomfort, do these exercises once each day or as told by your health care provider. EXERCISES Single Knee to Chest Repeat these steps 3-5 times for each leg: 4. Lie on your back on a firm bed or the floor with your legs extended. 5. Bring one knee to your chest. Your other leg should stay extended and in contact with the floor. 6. Hold your knee in place by grabbing your knee or thigh. 7. Pull on your knee until you feel a gentle stretch in your lower back. 8. Hold the stretch for 10-30 seconds. 9. Slowly release and straighten your leg. Pelvic Tilt Repeat these steps 5-10 times: 3. Lie on your back on a firm bed or the floor with your legs extended. 4. Bend your knees so they are pointing toward the ceiling and your feet are flat on the floor. 5. Tighten your lower abdominal muscles to  press your lower back against the  floor. This motion will tilt your pelvis so your tailbone points up toward the ceiling instead of pointing to your feet or the floor. 6. With gentle tension and even breathing, hold this position for 5-10 seconds. Cat-Cow Repeat these steps until your lower back becomes more flexible: 3. Get into a hands-and-knees position on a firm surface. Keep your hands under your shoulders, and keep your knees under your hips. You may place padding under your knees for comfort. 4. Let your head hang down, and point your tailbone toward the floor so your lower back becomes rounded like the back of a cat. 5. Hold this position for 5 seconds. 6. Slowly lift your head and point your tailbone up toward the ceiling so your back forms a sagging arch like the back of a cow. 7. Hold this position for 5 seconds. Press-Ups Repeat these steps 5-10 times: 5. Lie on your abdomen (face-down) on the floor. 6. Place your palms near your head, about shoulder-width apart. 7. While you keep your back as relaxed as possible and keep your hips on the floor, slowly straighten your arms to raise the top half of your body and lift your shoulders. Do not use your back muscles to raise your upper torso. You may adjust the placement of your hands to make yourself more comfortable. 8. Hold this position for 5 seconds while you keep your back relaxed. 9. Slowly return to lying flat on the floor. Bridges Repeat these steps 10 times: 9. Lie on your back on a firm surface. Kirby your knees so they are pointing toward the ceiling and your feet are flat on the floor. 17. Tighten your buttocks muscles and lift your buttocks off of the floor until your waist is at almost the same height as your knees. You should feel the muscles working in your buttocks and the back of your thighs. If you do not feel these muscles, slide your feet 1-2 inches farther away from your buttocks. 12. Hold this position for 3-5 seconds. 13. Slowly lower your hips  to the starting position, and allow your buttocks muscles to relax completely. If this exercise is too easy, try doing it with your arms crossed over your chest. Abdominal Crunches Repeat these steps 5-10 times: 4. Lie on your back on a firm bed or the floor with your legs extended. 5. Bend your knees so they are pointing toward the ceiling and your feet are flat on the floor. 6. Cross your arms over your chest. 7. Tip your chin slightly toward your chest without bending your neck. 8. Tighten your abdominal muscles and slowly raise your trunk (torso) high enough to lift your shoulder blades a tiny bit off of the floor. Avoid raising your torso higher than that, because it can put too much stress on your low back and it does not help to strengthen your abdominal muscles. 9. Slowly return to your starting position. Back Lifts Repeat these steps 5-10 times: 8. Lie on your abdomen (face-down) with your arms at your sides, and rest your forehead on the floor. 9. Tighten the muscles in your legs and your buttocks. 10. Slowly lift your chest off of the floor while you keep your hips pressed to the floor. Keep the back of your head in line with the curve in your back. Your eyes should be looking at the floor. 11. Hold this position for 3-5 seconds. 12. Slowly return to your starting position. SEEK  MEDICAL CARE IF:  Your back pain or discomfort gets much worse when you do an exercise.  Your back pain or discomfort does not lessen within 2 hours after you exercise. If you have any of these problems, stop doing these exercises right away. Do not do them again unless your health care provider says that you can. SEEK IMMEDIATE MEDICAL CARE IF:  You develop sudden, severe back pain. If this happens, stop doing the exercises right away. Do not do them again unless your health care provider says that you can.   This information is not intended to replace advice given to you by your health care provider.  Make sure you discuss any questions you have with your health care provider.   Document Released: 07/17/2004 Document Revised: 02/28/2015 Document Reviewed: 08/03/2014 Elsevier Interactive Patient Education [employer].

## 2015-10-15 NOTE — Assessment & Plan Note (Addendum)
Lipid panel today Discussed consuming a low fat diet

## 2015-10-15 NOTE — Assessment & Plan Note (Addendum)
Check A1C today Microalbumin done 03/2015 Discussed low carb diet and exercise to lose weight Continue checking fasting blood sugars Continue Metformin and Amaryl Foot exam today Advised her to make an appt for an eye exam

## 2015-10-15 NOTE — Assessment & Plan Note (Signed)
Encouraged her to work on diet and exercise 

## 2015-10-15 NOTE — Assessment & Plan Note (Signed)
Increasing in frequency Continue Advil as needed Will start Topamax daily If persist, RTC in 4 weeks   

## 2015-10-15 NOTE — Assessment & Plan Note (Addendum)
Increasing in frequency Continue Advil as needed Will start Topamax daily If persist, RTC in 4 weeks

## 2015-10-15 NOTE — Assessment & Plan Note (Addendum)
Deteriorated Increase Citalopram to 40 mg daily Continue Xanax prn

## 2015-10-15 NOTE — Progress Notes (Signed)
Pre visit review using our clinic review tool, if applicable. No additional management support is needed unless otherwise documented below in the visit note. 

## 2015-10-15 NOTE — Assessment & Plan Note (Addendum)
Well controlled Continue Protonix Discussed how weight loss could help improve her reflux

## 2015-10-15 NOTE — Assessment & Plan Note (Addendum)
Well controlled Continue Zyrtec prn 

## 2015-10-15 NOTE — Assessment & Plan Note (Signed)
Start Naproxen 500 mg BID

## 2015-10-15 NOTE — Progress Notes (Signed)
Subjective:    Patient ID: Deborah Francis, female    DOB: July 31, 1980, 35 y.o.   MRN: [id number]  HPI  Pt presents to the clinic today for her annual exam. She is also due for follow up of chronic conditions.  She has developed right sided chest wall pain. This started a few weeks ago, after she sneezed. She describes the pain as soreness. It is aggravated with certain movements. She has taken Ibuprofen with some relief. She denies palpitations, chest tightness or shortness of breath.  She has also c/o right sided low back pain. This started a few days ago. She describes the pain as sharp and stabbing. She does have some numbness and tingling into the back of her right thigh. She denies loss of bowel or bladder. She denies trauma, falls or injury. She has taken Ibuprofen with some relief.  Frequent Headaches: These occur 2-3/month. The headaches occur in her right temple. The pain radiates to her forehead and to the back of her head. She describes the pain as tightness, aching.  She does have sensitivity to light but not sound. She does have nausea but denies vomiting. She takes Advil as needed but reports it is not effective. She is interested in discussing preventative medicines.    GERD: She denies breakthrough symptoms on Protonix. She does try to avoid foods that make her reflux worse.  HLD: Her last LDL was 76. She does not consume a low fat diet. She is not on any cholesterol lowering medication  Seasonal Allergies: Worse in the Spring and Fall. She takes an OTC antihistamine as needed.  DM 2, uncontrolled: Her last A1C was 8.2%. Her sugars range 95-100. She is prescribed Metformin and Amaryl as prescribed. Her last eye exam was 2 years ago. Flu 03/2015. Pneumovax 03/2015. She denies any numbness and tingling in her hands or feet.   Obesity: She has a BMI of 55.24. Her weight today is 366 lbs. She does not adhere to any specific diet or exercise regimen.  Anxiety: She reports she as  increased anxiety since her last visit. Having increased stress at home with her husband. This has been an ongoing problem. Her son has severe ADHD and ODD. This stresses her out on a daily basis. She reports recent increase of panic attacks (3-4/wk). She is taking Celexa daily as prescribed. She takes Xanax as needed.   Flu: 03/2015 Tetanus: maybe 2008 Pneumovax: 03/2015 Pap Smear: 2015 (goes to gyn) Dentist: PRN  Vision: Can't remember when her last eye exam was.   Diet: Working on portion control. No specific diet. Occasional fried foods. At least 1 serving of veggies daily. Drinks mostly water and diet soda.   Exercise: Minimal exercise. Walks her dog.   Review of Systems      Past Medical History  Diagnosis Date  . Diabetes mellitus without complication ([place])   . Chicken pox   . GERD (gastroesophageal reflux disease)   . Frequent headaches   . Allergy   . Hyperlipidemia   . Anxiety     Current Outpatient Prescriptions  Medication Sig Dispense Refill  . ALPRAZolam (XANAX) 0.5 MG tablet Take 1 tablet (0.5 mg total) by mouth daily as needed for anxiety. 20 tablet 0  . Blood Glucose Monitoring Suppl (ONE TOUCH ULTRA 2) W/DEVICE KIT 1 Device by Does not apply route one time only at 6 PM. 1 each 0  . citalopram (CELEXA) 20 MG tablet TAKE 1 TABLET BY MOUTH DAILY 30 tablet  2  . glimepiride (AMARYL) 4 MG tablet TAKE 1/2 TABLET BY MOUTH EVERY DAY. 30 tablet 4  . glucose blood (ONE TOUCH TEST STRIPS) test strip 1 each by Other route 2 (two) times daily. Use as instructed 200 each 11  . ibuprofen (ADVIL,MOTRIN) 200 MG tablet Take 800 mg by mouth every 6 (six) hours as needed for moderate pain.    . Lancets (ONETOUCH ULTRASOFT) lancets 1 each by Other route 2 (two) times daily. Use as instructed 200 each 11  . metFORMIN (GLUCOPHAGE) 1000 MG tablet TAKE 1 TABLET BY MOUTH IN THE MORNING AND 1 & 1/2 TABLET AT NIGHT 75 tablet 4  . pantoprazole (PROTONIX) 40 MG tablet Take 1 tablet (40 mg  total) by mouth daily. 30 tablet 5  . Prenatal MV-Min-Fe Fum-FA-DHA (PRENATAL 1 PO) Take 1 capsule by mouth daily.    . traMADol (ULTRAM) 50 MG tablet Take 1 tablet (50 mg total) by mouth every 6 (six) hours as needed. 15 tablet 0   No current facility-administered medications for this visit.    No Known Allergies  Family History  Problem Relation Age of Onset  . Arthritis Mother   . Hyperlipidemia Mother   . Hypertension Mother   . Diabetes Mother   . Arthritis Father   . Cancer Father     prostate  . Hyperlipidemia Father   . Hypertension Father   . Diabetes Father   . Diabetes Sister   . Arthritis Maternal Grandmother   . Diabetes Maternal Grandmother   . Arthritis Maternal Grandfather   . Diabetes Maternal Grandfather   . Arthritis Paternal Grandmother   . Hypertension Paternal Grandmother   . Diabetes Paternal Grandmother   . Arthritis Paternal Grandfather   . Stroke Paternal Grandfather   . Diabetes Paternal Grandfather     Social History   Social History  . Marital Status: Married    Spouse Name: N/A  . Number of Children: N/A  . Years of Education: N/A   Occupational History  . Not on file.   Social History Main Topics  . Smoking status: Never Smoker   . Smokeless tobacco: Never Used  . Alcohol Use: No  . Drug Use: No  . Sexual Activity: Yes   Other Topics Concern  . Not on file   Social History Narrative     Constitutional: Pt reports increase in headache frequency. Denies fever, malaise, fatigue, or abrupt weight changes.  HEENT: Denies eye pain, eye redness, ear pain, ringing in the ears, wax buildup, runny nose, nasal congestion, bloody nose, or sore throat. Respiratory: Denies difficulty breathing, shortness of breath, cough or sputum production.   Cardiovascular: Denies chest tightness, palpitations or swelling in the hands or feet.  Gastrointestinal: Denies abdominal pain, bloating, constipation, diarrhea or blood in the stool.  GU:  Denies urgency, frequency, pain with urination, burning sensation, blood in urine, odor or discharge. Musculoskeletal: Pt reports right sided chest wall pain and right sided lower back pain. Denies decrease in range of motion, difficulty with gait, joint pain, and swelling.  Skin: Denies redness, rashes, lesions or ulcercations.  Neurological: Pt reports numbness/tingling down right leg. Denies dizziness, difficulty with memory, difficulty with speech or problems with balance and coordination.  Psych: Pt reports increased anxiety. Denies depression, SI/HI.  No other specific complaints in a complete review of systems (except as listed in HPI above).  Objective:   Physical Exam  BP 122/78 mmHg  Pulse 76  Temp(Src) 99 F (37.2 C) (  Oral)  Ht 5' 8.25" (1.734 m)  Wt 366 lb (166.017 kg)  BMI 55.21 kg/m2  LMP 09/23/2015 Wt Readings from Last 3 Encounters:  10/15/15 366 lb (166.017 kg)  08/29/15 366 lb (166.017 kg)  04/12/15 373 lb (169.192 kg)    General: Appears her stated age, obese in NAD. Skin: Warm, dry and intact.  HEENT: Head: normal shape and size; Eyes: sclera white, no icterus, conjunctiva pink; Throat/Mouth: Teeth present, mucosa pink and moist, no exudate, lesions or ulcerations noted.  Neck:  Neck supple, trachea midline. No masses, lumps or thyromegaly present.  Cardiovascular:  Normal rate and rhythm. S1,S2 noted.  No murmur, rubs or gallops noted. No JVD or BLE edema.  Pulmonary/Chest: Normal effort and positive vesicular breath sounds. No respiratory distress. No wheezes, rales or ronchi noted.  Abdomen: Soft and nontender. Normal bowel sounds. No distention or masses noted. Liver, spleen and kidneys non palpable. No CVA tenderness. Musculoskeletal: Pain with palpation of right side chest wall, along the sternal border. Right sided lower back. Normal flexion, extension and rotation of the spine. No bony tenderness noted. No difficulty with gait.  Neurological: Alert and  oriented. Cranial nerves II-XII grossly intact. Coordination normal.  Psychiatric: Mood and affect normal. Behavior is normal. Judgment and thought content normal.    BMET    Component Value Date/Time   NA 135 04/12/2015 0946   NA 135* 10/17/2012 2223   K 4.4 04/12/2015 0946   K 4.2 10/17/2012 2223   CL 101 04/12/2015 0946   CL 100 10/17/2012 2223   CO2 26 04/12/2015 0946   CO2 25 10/17/2012 2223   GLUCOSE 204* 04/12/2015 0946   GLUCOSE 383* 10/17/2012 2223   BUN 15 04/12/2015 0946   BUN 7 10/17/2012 2223   CREATININE 0.76 04/12/2015 0946   CREATININE 0.77 10/17/2012 2223   CREATININE 0.78 06/03/2007 1626   CALCIUM 9.0 04/12/2015 0946   CALCIUM 8.8 10/17/2012 2223   GFRNONAA >90 02/26/2014 1253   GFRNONAA >60 10/17/2012 2223   GFRAA >90 02/26/2014 1253   GFRAA >60 10/17/2012 2223    Lipid Panel     Component Value Date/Time   CHOL 149 04/12/2015 0946   TRIG 118.0 04/12/2015 0946   HDL 49.80 04/12/2015 0946   CHOLHDL 3 04/12/2015 0946   VLDL 23.6 04/12/2015 0946   LDLCALC 76 04/12/2015 0946    CBC    Component Value Date/Time   WBC 9.4 04/12/2015 0946   WBC 10.7 10/17/2012 2223   RBC 4.68 04/12/2015 0946   RBC 5.10 10/17/2012 2223   HGB 11.2* 04/12/2015 0946   HGB 12.9 10/17/2012 2223   HCT 35.1* 04/12/2015 0946   HCT 39.5 10/17/2012 2223   PLT 246.0 04/12/2015 0946   PLT 234 10/17/2012 2223   MCV 75.0* 04/12/2015 0946   MCV 77* 10/17/2012 2223   MCH 25.1* 02/26/2014 1253   MCH 25.2* 10/17/2012 2223   MCHC 32.0 04/12/2015 0946   MCHC 32.6 10/17/2012 2223   RDW 15.7* 04/12/2015 0946   RDW 15.0* 10/17/2012 2223    Hgb A1C Lab Results  Component Value Date   HGBA1C 8.2* 04/12/2015         Assessment & Plan:   Routine medical exam with abnormal findings:  Flu shot UTD Tetanus due 2018 She reports she had a pap smear in 2015- asked her to provide me with a copy Encouraged her to see an dentist and eye doctor at least annually Encouraged her  to consume a  low fat, low carb diet and increase exercise  Right sided chest wall pain:  MSK in origin Naproxen 500 mg BID with meals A heating pad may be helpful  Low back pain with right side sciatica:  Naproxen 500 mg BID with meals Stretching exercised given If persists, consider further workup with xray/MRI- will need to be done at hospital due to morbid obesity   RTC in 6 months for follow up chronic conditions, sooner if needed

## 2015-10-19 NOTE — Addendum Note (Signed)
Addended by: Roena MaladyEVONTENNO, Vasilis Luhman Y on: 10/19/2015 10:33 AM   Modules accepted: Orders, Medications

## 2015-10-26 ENCOUNTER — Telehealth: Payer: Self-pay

## 2015-10-26 MED ORDER — GLIMEPIRIDE 4 MG PO TABS: 4.0000 mg | ORAL_TABLET | Freq: Every day | ORAL | Status: DC

## 2015-10-26 NOTE — Telephone Encounter (Signed)
Deborah Francis with [facility] left v/m requesting updated rx for glimepiride with instructions 1 tab daily. Done per protocol. Pt had annual exam on 10/15/15 and glimepiride was increased at 10/16/15 lab result note.

## 2015-11-30 ENCOUNTER — Other Ambulatory Visit: Payer: Self-pay | Admitting: Internal Medicine

## 2015-12-21 ENCOUNTER — Other Ambulatory Visit: Payer: Self-pay

## 2015-12-21 DIAGNOSIS — F411 Generalized anxiety disorder: Secondary | ICD-10-CM

## 2015-12-21 MED ORDER — ALPRAZOLAM 0.5 MG PO TABS: 0.5000 mg | ORAL_TABLET | Freq: Every day | ORAL | Status: DC | PRN

## 2015-12-21 NOTE — Telephone Encounter (Signed)
Ok to phone in Xanax 

## 2015-12-21 NOTE — Telephone Encounter (Signed)
Rx called in to pharmacy. 

## 2015-12-21 NOTE — Telephone Encounter (Signed)
Last filled 03/2015--last CPE 09/2015--please advise

## 2016-01-28 ENCOUNTER — Other Ambulatory Visit: Payer: Self-pay | Admitting: Internal Medicine

## 2016-01-28 DIAGNOSIS — F411 Generalized anxiety disorder: Secondary | ICD-10-CM

## 2016-02-04 ENCOUNTER — Other Ambulatory Visit: Payer: Self-pay | Admitting: Internal Medicine

## 2016-04-04 ENCOUNTER — Other Ambulatory Visit: Payer: Self-pay

## 2016-04-04 DIAGNOSIS — F411 Generalized anxiety disorder: Secondary | ICD-10-CM

## 2016-04-04 NOTE — Telephone Encounter (Signed)
Last filled 12/21/2015--please advise 

## 2016-04-07 MED ORDER — ALPRAZOLAM 0.5 MG PO TABS: 0.5000 mg | ORAL_TABLET | Freq: Every day | ORAL | 0 refills | Status: DC | PRN

## 2016-04-07 NOTE — Telephone Encounter (Signed)
Ok to phone in Xanax 

## 2016-04-07 NOTE — Telephone Encounter (Signed)
Rx called in to pharmacy. 

## 2016-04-15 ENCOUNTER — Ambulatory Visit: Payer: BLUE CROSS/BLUE SHIELD | Admitting: Internal Medicine

## 2016-04-22 ENCOUNTER — Ambulatory Visit: Payer: BLUE CROSS/BLUE SHIELD | Admitting: Internal Medicine

## 2016-05-06 ENCOUNTER — Other Ambulatory Visit: Payer: Self-pay | Admitting: Internal Medicine

## 2016-05-06 DIAGNOSIS — F411 Generalized anxiety disorder: Secondary | ICD-10-CM

## 2016-05-12 ENCOUNTER — Ambulatory Visit: Payer: BLUE CROSS/BLUE SHIELD | Admitting: Internal Medicine

## 2016-06-12 ENCOUNTER — Other Ambulatory Visit: Payer: Self-pay | Admitting: Internal Medicine

## 2016-06-12 DIAGNOSIS — F411 Generalized anxiety disorder: Secondary | ICD-10-CM

## 2016-07-02 ENCOUNTER — Other Ambulatory Visit: Payer: Self-pay | Admitting: Internal Medicine

## 2016-07-02 DIAGNOSIS — R51 Headache: Principal | ICD-10-CM

## 2016-07-02 DIAGNOSIS — R519 Headache, unspecified: Secondary | ICD-10-CM

## 2016-08-07 ENCOUNTER — Ambulatory Visit (INDEPENDENT_AMBULATORY_CARE_PROVIDER_SITE_OTHER): Payer: BLUE CROSS/BLUE SHIELD | Admitting: Internal Medicine

## 2016-08-07 ENCOUNTER — Encounter: Payer: Self-pay | Admitting: Internal Medicine

## 2016-08-07 VITALS — BP 124/78 | HR 65 | Temp 98.1°F | Wt 362.0 lb

## 2016-08-07 DIAGNOSIS — J301 Allergic rhinitis due to pollen: Secondary | ICD-10-CM | POA: Diagnosis not present

## 2016-08-07 DIAGNOSIS — R519 Headache, unspecified: Secondary | ICD-10-CM

## 2016-08-07 DIAGNOSIS — F411 Generalized anxiety disorder: Secondary | ICD-10-CM | POA: Diagnosis not present

## 2016-08-07 DIAGNOSIS — E1141 Type 2 diabetes mellitus with diabetic mononeuropathy: Secondary | ICD-10-CM

## 2016-08-07 DIAGNOSIS — E7800 Pure hypercholesterolemia, unspecified: Secondary | ICD-10-CM

## 2016-08-07 DIAGNOSIS — K219 Gastro-esophageal reflux disease without esophagitis: Secondary | ICD-10-CM

## 2016-08-07 DIAGNOSIS — R51 Headache: Secondary | ICD-10-CM

## 2016-08-07 LAB — COMPREHENSIVE METABOLIC PANEL
ALK PHOS: 74 U/L (ref 39–117)
ALT: 10 U/L (ref 0–35)
AST: 8 U/L (ref 0–37)
Albumin: 3.8 g/dL (ref 3.5–5.2)
BILIRUBIN TOTAL: 0.2 mg/dL (ref 0.2–1.2)
BUN: 12 mg/dL (ref 6–23)
CO2: 28 mEq/L (ref 19–32)
CREATININE: 0.78 mg/dL (ref 0.40–1.20)
Calcium: 8.8 mg/dL (ref 8.4–10.5)
Chloride: 103 mEq/L (ref 96–112)
GFR: 90.14 mL/min (ref >60.00)
GLUCOSE: 104 mg/dL — AB (ref 70–99)
Potassium: 4.4 mEq/L (ref 3.5–5.1)
SODIUM: 136 meq/L (ref 135–145)
TOTAL PROTEIN: 7 g/dL (ref 6.0–8.3)

## 2016-08-07 LAB — LIPID PANEL
Cholesterol: 148 mg/dL (ref 0–200)
HDL: 40.7 mg/dL (ref >39.00)
LDL Cholesterol: 74 mg/dL (ref 0–99)
NONHDL: 106.93
Total CHOL/HDL Ratio: 4
Triglycerides: 165 mg/dL — ABNORMAL HIGH (ref 0.0–149.0)
VLDL: 33 mg/dL (ref 0.0–40.0)

## 2016-08-07 LAB — MICROALBUMIN / CREATININE URINE RATIO
CREATININE, U: 174.2 mg/dL
MICROALB/CREAT RATIO: 0.4 mg/g (ref 0.0–30.0)

## 2016-08-07 LAB — HEMOGLOBIN A1C: HEMOGLOBIN A1C: 7.6 % — AB (ref 4.6–6.5)

## 2016-08-07 MED ORDER — ALPRAZOLAM 0.5 MG PO TABS: 0.5000 mg | ORAL_TABLET | Freq: Every day | ORAL | 0 refills | Status: AC | PRN

## 2016-08-07 MED ORDER — CITALOPRAM HYDROBROMIDE 40 MG PO TABS: 40.0000 mg | ORAL_TABLET | Freq: Every day | ORAL | 2 refills | Status: DC

## 2016-08-07 NOTE — Assessment & Plan Note (Signed)
A1C and microalbumin today Encouraged her to consume a low carb diet and exercise for weight loss Foot exam today Encouraged her to make an appt for an eye exam, she declines at this time Continue Metformin and Amaryl, will adjust as needed Immunizations UTD

## 2016-08-07 NOTE — Assessment & Plan Note (Signed)
Discussed how avoiding triggers and decreasing her weight could help symptoms Continue Protonix for now, will try to wean at next visit CMET today

## 2016-08-07 NOTE — Patient Instructions (Signed)
Carbohydrate Counting for Diabetes Mellitus, Adult Carbohydrate counting is a method for keeping track of how many carbohydrates you eat. Eating carbohydrates naturally increases the amount of sugar (glucose) in the blood. Counting how many carbohydrates you eat helps keep your blood glucose within normal limits, which helps you manage your diabetes (diabetes mellitus). It is important to know how many carbohydrates you can safely have in each meal. This is different for every person. A diet and nutrition specialist (registered dietitian) can help you make a meal plan and calculate how many carbohydrates you should have at each meal and snack. Carbohydrates are found in the following foods:  Grains, such as breads and cereals.  Dried beans and soy products.  Starchy vegetables, such as potatoes, peas, and corn.  Fruit and fruit juices.  Milk and yogurt.  Sweets and snack foods, such as cake, cookies, candy, chips, and soft drinks. How do I count carbohydrates? There are two ways to count carbohydrates in food. You can use either of the methods or a combination of both. Reading "Nutrition Facts" on packaged food  The "Nutrition Facts" list is included on the labels of almost all packaged foods and beverages in the [place] It includes:  The serving size.  Information about nutrients in each serving, including the grams (g) of carbohydrate per serving. To use the "Nutrition Facts":  Decide how many servings you will have.  Multiply the number of servings by the number of carbohydrates per serving.  The resulting number is the total amount of carbohydrates that you will be having. Learning standard serving sizes of other foods  When you eat foods containing carbohydrates that are not packaged or do not include "Nutrition Facts" on the label, you need to measure the servings in order to count the amount of carbohydrates:  Measure the foods that you will eat with a food scale or measuring  cup, if needed.  Decide how many standard-size servings you will eat.  Multiply the number of servings by 15. Most carbohydrate-rich foods have about 15 g of carbohydrates per serving.  For example, if you eat 8 oz (170 g) of strawberries, you will have eaten 2 servings and 30 g of carbohydrates (2 servings x 15 g = 30 g).  For foods that have more than one food mixed, such as soups and casseroles, you must count the carbohydrates in each food that is included. The following list contains standard serving sizes of common carbohydrate-rich foods. Each of these servings has about 15 g of carbohydrates:   hamburger bun or  English muffin.   oz (15 mL) syrup.   oz (14 g) jelly.  1 slice of bread.  1 six-inch tortilla.  3 oz (85 g) cooked rice or pasta.  4 oz (113 g) cooked dried beans.  4 oz (113 g) starchy vegetable, such as peas, corn, or potatoes.  4 oz (113 g) hot cereal.  4 oz (113 g) mashed potatoes or  of a large baked potato.  4 oz (113 g) canned or frozen fruit.  4 oz (120 mL) fruit juice.  4-6 crackers.  6 chicken nuggets.  6 oz (170 g) unsweetened dry cereal.  6 oz (170 g) plain fat-free yogurt or yogurt sweetened with artificial sweeteners.  8 oz (240 mL) milk.  8 oz (170 g) fresh fruit or one small piece of fruit.  24 oz (680 g) popped popcorn. Example of carbohydrate counting Sample meal  3 oz (85 g) chicken breast.  6 oz (  170 g) brown rice.  4 oz (113 g) corn.  8 oz (240 mL) milk.  8 oz (170 g) strawberries with sugar-free whipped topping. Carbohydrate calculation 1. Identify the foods that contain carbohydrates:  Rice.  Corn.  Milk.  Strawberries. 2. Calculate how many servings you have of each food:  2 servings rice.  1 serving corn.  1 serving milk.  1 serving strawberries. 3. Multiply each number of servings by 15 g:  2 servings rice x 15 g = 30 g.  1 serving corn x 15 g = 15 g.  1 serving milk x 15 g = 15  g.  1 serving strawberries x 15 g = 15 g. 4. Add together all of the amounts to find the total grams of carbohydrates eaten:  30 g + 15 g + 15 g + 15 g = 75 g of carbohydrates total. This information is not intended to replace advice given to you by your health care provider. Make sure you discuss any questions you have with your health care provider. Document Released: 06/09/2005 Document Revised: 12/28/2015 Document Reviewed: 11/21/2015 Elsevier Interactive Patient Education  2017 [employer].  

## 2016-08-07 NOTE — Assessment & Plan Note (Signed)
Ongoing issues Continue Celexa and Xanax Xanax refilled today Support offered today

## 2016-08-07 NOTE — Progress Notes (Signed)
Subjective:    Patient ID: Deborah Francis, female    DOB: 1980/12/31, 36 y.o.   MRN: [id number]  HPI  Pt presents to the clinic today for follow up of chronic conditions.  Frequent Headaches: She is only getting these about 1 x month. They still occur in her right temple. The pain radiates to her forehead and base of her skull. She has sensitivity to light but not sound. She has nausea but denies vomiting. She was started on Topamax at her last visit and she reports it is working well for her. She takes Advil as needed.  GERD: Triggered by greasy, tomato based foods and eating late at night. She takes Protonix daily with good relief. She denies breakthrough symptoms.  HLD: Her last LDL was 98, 09/2015. She is not taking any cholesterol lowering medications. She does not consume a low fat diet.  Seasonal Allergies: Worse in the spring and fall. She takes Zyrtec OTC as needed with good relief of symptoms.   DM 2: Her last A1C was 7.7, 09/2015. Her sugars range 100-130. She is taking Metformin 1000 mg BID. Her Amaryl was increased to 4 mg daily. Her last eye exam was > 2 year ago. Flu 06/2015. Pneumovax 03/2015.  Obesity: Her weight today is 362 lbs with a BMI of 54.64. She is currently not working on diet or exercise.  Anxiety: This is triggered by family stress. She is getting a divorce from her husband. She is taking Celexa daily and takes Xanax 1-2 x week. She is requesting a refill of the Xanax today.  Review of Systems   Past Medical History:  Diagnosis Date  . Allergy   . Anxiety   . Chicken pox   . Diabetes mellitus without complication ([place])   . Frequent headaches   . GERD (gastroesophageal reflux disease)   . Hyperlipidemia     Current Outpatient Prescriptions  Medication Sig Dispense Refill  . ALPRAZolam (XANAX) 0.5 MG tablet Take 1 tablet (0.5 mg total) by mouth daily as needed for anxiety. 20 tablet 0  . Blood Glucose Monitoring Suppl (ONE TOUCH ULTRA 2) W/DEVICE KIT 1  Device by Does not apply route one time only at 6 PM. 1 each 0  . citalopram (CELEXA) 40 MG tablet Take 1 tablet (40 mg total) by mouth daily. MUST SCHEDULE FOLLOW UP OFFICE VISIT 30 tablet 1  . glimepiride (AMARYL) 4 MG tablet Take 1 tablet (4 mg total) by mouth daily with breakfast. MUST SCHEDULE FOLLOW UP OFFICE VISIT 30 tablet 1  . glucose blood (ONE TOUCH TEST STRIPS) test strip 1 each by Other route 2 (two) times daily. Use as instructed 200 each 11  . ibuprofen (ADVIL,MOTRIN) 200 MG tablet Take 800 mg by mouth every 6 (six) hours as needed for moderate pain.    . Lancets (ONETOUCH ULTRASOFT) lancets 1 each by Other route 2 (two) times daily. Use as instructed 200 each 11  . metFORMIN (GLUCOPHAGE) 1000 MG tablet TAKE ONE TABLET BY MOUTH IN THE MORNING AND TAKE ONE AND ONE-HALF TABLETS BY MOUTH AT NIGHT. 75 tablet 4  . naproxen (NAPROSYN) 500 MG tablet Take 1 tablet (500 mg total) by mouth 2 (two) times daily with a meal. 30 tablet 2  . pantoprazole (PROTONIX) 40 MG tablet Take 1 tablet (40 mg total) by mouth daily. 30 tablet 5  . pantoprazole (PROTONIX) 40 MG tablet TAKE 1 TABLET BY MOUTH DAILY 30 tablet 11  . Prenatal MV-Min-Fe Fum-FA-DHA (PRENATAL 1  PO) Take 1 capsule by mouth daily.    Marland Kitchen topiramate (TOPAMAX) 25 MG tablet TAKE 1 TABLET BY MOUTH DAILY 30 tablet 0  . traMADol (ULTRAM) 50 MG tablet Take 1 tablet (50 mg total) by mouth every 6 (six) hours as needed. 15 tablet 0   No current facility-administered medications for this visit.     No Known Allergies  Family History  Problem Relation Age of Onset  . Arthritis Mother   . Hyperlipidemia Mother   . Hypertension Mother   . Diabetes Mother   . Arthritis Father   . Cancer Father     prostate  . Hyperlipidemia Father   . Hypertension Father   . Diabetes Father   . Diabetes Sister   . Arthritis Maternal Grandmother   . Diabetes Maternal Grandmother   . Arthritis Maternal Grandfather   . Diabetes Maternal Grandfather   .  Arthritis Paternal Grandmother   . Hypertension Paternal Grandmother   . Diabetes Paternal Grandmother   . Arthritis Paternal Grandfather   . Stroke Paternal Grandfather   . Diabetes Paternal Grandfather     Social History   Social History  . Marital status: Married    Spouse name: N/A  . Number of children: N/A  . Years of education: N/A   Occupational History  . Not on file.   Social History Main Topics  . Smoking status: Never Smoker  . Smokeless tobacco: Never Used  . Alcohol use No  . Drug use: No  . Sexual activity: Yes   Other Topics Concern  . Not on file   Social History Narrative  . No narrative on file     Constitutional: Denies fever, malaise, fatigue, headache or abrupt weight changes.  HEENT: Denies eye pain, eye redness, ear pain, ringing in the ears, wax buildup, runny nose, nasal congestion, bloody nose, or sore throat. Respiratory: Denies difficulty breathing, shortness of breath, cough or sputum production.   Cardiovascular: Denies chest pain, chest tightness, palpitations or swelling in the hands or feet.  Gastrointestinal: Denies abdominal pain, bloating, constipation, diarrhea or blood in the stool.  GU: Denies urgency, frequency, pain with urination, burning sensation, blood in urine, odor or discharge. Musculoskeletal: Denies decrease in range of motion, difficulty with gait, muscle pain or joint pain and swelling.  Skin: Denies redness, rashes, lesions or ulcercations.  Neurological: Pt reports tingling sensation in her feet. Denies dizziness, difficulty with memory, difficulty with speech or problems with balance and coordination.  Psych: Pt reports anxiety. Denies depression, SI/HI.  No other specific complaints in a complete review of systems (except as listed in HPI above).     Objective:   Physical Exam   BP 124/78   Pulse 65   Temp 98.1 F (36.7 C) (Oral)   Wt (!) 362 lb (164.2 kg)   SpO2 98%   BMI 54.64 kg/m  Wt Readings from  Last 3 Encounters:  08/07/16 (!) 362 lb (164.2 kg)  10/15/15 (!) 366 lb (166 kg)  08/29/15 (!) 366 lb (166 kg)    General: Appears her stated age, obese in NAD. Skin: Warm, dry and intact. No ulcerations noted. HEENT: Head: normal shape and size; Eyes: sclera white, no icterus, conjunctiva pink, PERRLA and EOMs intact;  Cardiovascular: Normal rate and rhythm. S1,S2 noted.  No murmur, rubs or gallops noted. No JVD or BLE edema.  Pulmonary/Chest: Normal effort and positive vesicular breath sounds. No respiratory distress. No wheezes, rales or ronchi noted.  Neurological: Alert and  oriented. Sensation intact to BLE. Psychiatric: Mood and affect mildly flat. Behavior is normal. Judgment and thought content normal.     BMET    Component Value Date/Time   NA 136 10/15/2015 0943   NA 135 (L) 10/17/2012 2223   K 4.5 10/15/2015 0943   K 4.2 10/17/2012 2223   CL 102 10/15/2015 0943   CL 100 10/17/2012 2223   CO2 25 10/15/2015 0943   CO2 25 10/17/2012 2223   GLUCOSE 177 (H) 10/15/2015 0943   GLUCOSE 383 (H) 10/17/2012 2223   BUN 11 10/15/2015 0943   BUN 7 10/17/2012 2223   CREATININE 0.80 10/15/2015 0943   CREATININE 0.77 10/17/2012 2223   CALCIUM 9.2 10/15/2015 0943   CALCIUM 8.8 10/17/2012 2223   GFRNONAA >90 02/26/2014 1253   GFRNONAA >60 10/17/2012 2223   GFRAA >90 02/26/2014 1253   GFRAA >60 10/17/2012 2223    Lipid Panel     Component Value Date/Time   CHOL 168 10/15/2015 0943   TRIG 125.0 10/15/2015 0943   HDL 45.70 10/15/2015 0943   CHOLHDL 4 10/15/2015 0943   VLDL 25.0 10/15/2015 0943   LDLCALC 98 10/15/2015 0943    CBC    Component Value Date/Time   WBC 9.4 10/15/2015 0943   RBC 4.74 10/15/2015 0943   HGB 10.9 (L) 10/15/2015 0943   HGB 12.9 10/17/2012 2223   HCT 34.1 (L) 10/15/2015 0943   HCT 39.5 10/17/2012 2223   PLT 262.0 10/15/2015 0943   PLT 234 10/17/2012 2223   MCV 72.1 (L) 10/15/2015 0943   MCV 77 (L) 10/17/2012 2223   MCH 25.1 (L) 02/26/2014  1253   MCHC 31.8 10/15/2015 0943   RDW 16.9 (H) 10/15/2015 0943   RDW 15.0 (H) 10/17/2012 2223    Hgb A1C Lab Results  Component Value Date   HGBA1C 7.7 (H) 10/15/2015           Assessment & Plan:

## 2016-08-07 NOTE — Assessment & Plan Note (Signed)
Advised her to consume a 1800 calorie diet over the next month

## 2016-08-07 NOTE — Assessment & Plan Note (Signed)
Diet controlled Lipid profile today

## 2016-08-07 NOTE — Assessment & Plan Note (Signed)
Continue Zyrtec prn.

## 2016-08-07 NOTE — Assessment & Plan Note (Signed)
Improved on Topamax CMET today Continue Advil prn

## 2016-08-14 MED ORDER — HYDROCHLOROTHIAZIDE 12.5 MG PO CAPS: 12.5000 mg | ORAL_CAPSULE | Freq: Every day | ORAL | 2 refills | Status: DC | PRN

## 2016-08-14 MED ORDER — GLIPIZIDE 10 MG PO TABS: 10.0000 mg | ORAL_TABLET | Freq: Two times a day (BID) | ORAL | 2 refills | Status: DC

## 2016-08-14 NOTE — Addendum Note (Signed)
Addended by: Lorre MunroeBAITY, REGINA W on: 08/14/2016 10:52 AM   Modules accepted: Orders

## 2016-09-07 ENCOUNTER — Other Ambulatory Visit: Payer: Self-pay | Admitting: Internal Medicine

## 2016-09-07 DIAGNOSIS — R51 Headache: Principal | ICD-10-CM

## 2016-09-07 DIAGNOSIS — R519 Headache, unspecified: Secondary | ICD-10-CM

## 2016-09-08 ENCOUNTER — Encounter (INDEPENDENT_AMBULATORY_CARE_PROVIDER_SITE_OTHER): Payer: Self-pay

## 2016-09-08 ENCOUNTER — Encounter: Payer: Self-pay | Admitting: Internal Medicine

## 2016-09-08 ENCOUNTER — Ambulatory Visit (INDEPENDENT_AMBULATORY_CARE_PROVIDER_SITE_OTHER): Payer: BLUE CROSS/BLUE SHIELD | Admitting: Internal Medicine

## 2016-09-08 VITALS — BP 122/72 | HR 72 | Temp 97.9°F | Wt 361.0 lb

## 2016-09-08 DIAGNOSIS — E1149 Type 2 diabetes mellitus with other diabetic neurological complication: Secondary | ICD-10-CM

## 2016-09-08 MED ORDER — GABAPENTIN 100 MG PO CAPS: 100.0000 mg | ORAL_CAPSULE | Freq: Every day | ORAL | 2 refills | Status: DC

## 2016-09-08 NOTE — Progress Notes (Signed)
Subjective:    Patient ID: Deborah Francis, female    DOB: August 17, 1980, 36 y.o.   MRN: [id number]  HPI  Pt presents to the clinic today with c/o pain in her bilateral feet. She reports this started a few weeks ago. She describes the pain as pins and needles. It is intermittent. The pain does not radiate. She denies numbness. She denies joint pain or swelling. She denies any injury to the area. She has tried Ibuprofen, Tylenol and elevation without any relief. She does have DM 2, last A1C was 7.6%.  Review of Systems      Past Medical History:  Diagnosis Date  . Allergy   . Anxiety   . Chicken pox   . Diabetes mellitus without complication ([place])   . Frequent headaches   . GERD (gastroesophageal reflux disease)   . Hyperlipidemia     Current Outpatient Prescriptions  Medication Sig Dispense Refill  . ALPRAZolam (XANAX) 0.5 MG tablet Take 1 tablet (0.5 mg total) by mouth daily as needed for anxiety. 20 tablet 0  . Blood Glucose Monitoring Suppl (ONE TOUCH ULTRA 2) W/DEVICE KIT 1 Device by Does not apply route one time only at 6 PM. 1 each 0  . citalopram (CELEXA) 40 MG tablet Take 1 tablet (40 mg total) by mouth daily. 30 tablet 2  . glipiZIDE (GLUCOTROL) 10 MG tablet Take 1 tablet (10 mg total) by mouth 2 (two) times daily before a meal. 60 tablet 2  . glucose blood (ONE TOUCH TEST STRIPS) test strip 1 each by Other route 2 (two) times daily. Use as instructed 200 each 11  . hydrochlorothiazide (MICROZIDE) 12.5 MG capsule Take 1 capsule (12.5 mg total) by mouth daily as needed. 30 capsule 2  . ibuprofen (ADVIL,MOTRIN) 200 MG tablet Take 800 mg by mouth every 6 (six) hours as needed for moderate pain.    . Lancets (ONETOUCH ULTRASOFT) lancets 1 each by Other route 2 (two) times daily. Use as instructed 200 each 11  . metFORMIN (GLUCOPHAGE) 1000 MG tablet TAKE 1 TABLET BY MOUTH EVERY MORNING AND1.5 TABLETS AT NIGHT 75 tablet 5  . Norgestimate-Ethinyl Estradiol Triphasic (TRI-SPRINTEC)  0.18/0.215/0.25 MG-35 MCG tablet Take 1 tablet by mouth daily.    . pantoprazole (PROTONIX) 40 MG tablet Take 1 tablet (40 mg total) by mouth daily. 30 tablet 5  . Prenatal MV-Min-Fe Fum-FA-DHA (PRENATAL 1 PO) Take 1 capsule by mouth daily.    Marland Kitchen topiramate (TOPAMAX) 25 MG tablet Take 1 tablet (25 mg total) by mouth daily. 30 tablet 2  . naproxen (NAPROSYN) 500 MG tablet Take 1 tablet (500 mg total) by mouth 2 (two) times daily with a meal. (Patient not taking: Reported on 09/08/2016) 30 tablet 2   No current facility-administered medications for this visit.     No Known Allergies  Family History  Problem Relation Age of Onset  . Arthritis Mother   . Hyperlipidemia Mother   . Hypertension Mother   . Diabetes Mother   . Arthritis Father   . Cancer Father     prostate  . Hyperlipidemia Father   . Hypertension Father   . Diabetes Father   . Diabetes Sister   . Arthritis Maternal Grandmother   . Diabetes Maternal Grandmother   . Arthritis Maternal Grandfather   . Diabetes Maternal Grandfather   . Arthritis Paternal Grandmother   . Hypertension Paternal Grandmother   . Diabetes Paternal Grandmother   . Arthritis Paternal Grandfather   . Stroke  Paternal Grandfather   . Diabetes Paternal Grandfather     Social History   Social History  . Marital status: Married    Spouse name: N/A  . Number of children: N/A  . Years of education: N/A   Occupational History  . Not on file.   Social History Main Topics  . Smoking status: Never Smoker  . Smokeless tobacco: Never Used  . Alcohol use No  . Drug use: No  . Sexual activity: Yes   Other Topics Concern  . Not on file   Social History Narrative  . No narrative on file     Constitutional: Denies fever, malaise, fatigue, headache or abrupt weight changes.  Skin: Denies redness, rashes, lesions or ulcercations.  Neurological: Pt reports pins and needles sensation in feet. Denies dizziness, difficulty with memory, difficulty  with speech or problems with balance and coordination.    No other specific complaints in a complete review of systems (except as listed in HPI above).  Objective:   Physical Exam   BP 122/72   Pulse 72   Temp 97.9 F (36.6 C) (Oral)   Wt (!) 361 lb (163.7 kg)   BMI 54.49 kg/m  Wt Readings from Last 3 Encounters:  09/08/16 (!) 361 lb (163.7 kg)  08/07/16 (!) 362 lb (164.2 kg)  10/15/15 (!) 366 lb (166 kg)    General: Appears her stated age, obese in NAD. Skin: Warm, dry and intact. No rashes, lesions or ulcerations noted. Musculoskeletal: No difficulty with gait.  Neurological: Normal sensation BLE.    BMET    Component Value Date/Time   NA 136 08/07/2016 1034   NA 135 (L) 10/17/2012 2223   K 4.4 08/07/2016 1034   K 4.2 10/17/2012 2223   CL 103 08/07/2016 1034   CL 100 10/17/2012 2223   CO2 28 08/07/2016 1034   CO2 25 10/17/2012 2223   GLUCOSE 104 (H) 08/07/2016 1034   GLUCOSE 383 (H) 10/17/2012 2223   BUN 12 08/07/2016 1034   BUN 7 10/17/2012 2223   CREATININE 0.78 08/07/2016 1034   CREATININE 0.77 10/17/2012 2223   CALCIUM 8.8 08/07/2016 1034   CALCIUM 8.8 10/17/2012 2223   GFRNONAA >90 02/26/2014 1253   GFRNONAA >60 10/17/2012 2223   GFRAA >90 02/26/2014 1253   GFRAA >60 10/17/2012 2223    Lipid Panel     Component Value Date/Time   CHOL 148 08/07/2016 1034   TRIG 165.0 (H) 08/07/2016 1034   HDL 40.70 08/07/2016 1034   CHOLHDL 4 08/07/2016 1034   VLDL 33.0 08/07/2016 1034   LDLCALC 74 08/07/2016 1034    CBC    Component Value Date/Time   WBC 9.4 10/15/2015 0943   RBC 4.74 10/15/2015 0943   HGB 10.9 (L) 10/15/2015 0943   HGB 12.9 10/17/2012 2223   HCT 34.1 (L) 10/15/2015 0943   HCT 39.5 10/17/2012 2223   PLT 262.0 10/15/2015 0943   PLT 234 10/17/2012 2223   MCV 72.1 (L) 10/15/2015 0943   MCV 77 (L) 10/17/2012 2223   MCH 25.1 (L) 02/26/2014 1253   MCHC 31.8 10/15/2015 0943   RDW 16.9 (H) 10/15/2015 0943   RDW 15.0 (H) 10/17/2012 2223     Hgb A1C Lab Results  Component Value Date   HGBA1C 7.6 (H) 08/07/2016           Assessment & Plan:   Diabetic neuropathy:  Discussed the importance of tight sugar control eRx for Neurontin 100 mg QHS Return precautions discussed  Follow up as previously scheduled Webb Silversmith, NP

## 2016-09-08 NOTE — Patient Instructions (Signed)
Diabetic Neuropathy Diabetic neuropathy is a nerve disease or nerve damage that is caused by diabetes mellitus. About half of all people with diabetes mellitus have some form of nerve damage. Nerve damage is more common in those who have had diabetes mellitus for many years and who generally have not had good control of their blood sugar (glucose) level. Diabetic neuropathy is a common complication of diabetes mellitus. There are three common types of diabetic neuropathy and a fourth type that is less common and less understood:  Peripheral neuropathy-This is the most common type of diabetic neuropathy. It causes damage to the nerves of the feet and legs first and then eventually the hands and arms. The damage affects the ability to sense touch.  Autonomic neuropathy-This type causes damage to the autonomic nervous system, which controls the following functions: ? Heartbeat. ? Body temperature. ? Blood pressure. ? Urination. ? Digestion. ? Sweating. ? Sexual function.  Focal neuropathy-Focal neuropathy can be painful and unpredictable and occurs most often in older adults with diabetes mellitus. It involves a specific nerve or one area and often comes on suddenly. It usually does not cause long-term problems.  Radiculoplexus neuropathy- Sometimes called lumbosacral radiculoplexus neuropathy, radiculoplexus neuropathy affects the nerves of the thighs, hips, buttocks, or legs. It is more common in people with type 2 diabetes mellitus and in older men. It is characterized by debilitating pain, weakness, and atrophy, usually in the thigh muscles.  What are the causes? The cause of peripheral, autonomic, and focal neuropathies is diabetes mellitus that is uncontrolled and high glucose levels. The cause of radiculoplexus neuropathy is unknown. However, it is thought to be caused by inflammation related to uncontrolled glucose levels. What are the signs or symptoms? Peripheral Neuropathy Peripheral  neuropathy develops slowly over time. When the nerves of the feet and legs no longer work there may be:  Burning, stabbing, or aching pain in the legs or feet.  Inability to feel pressure or pain in your feet. This can lead to: ? Thick calluses over pressure areas. ? Pressure sores. ? Ulcers.  Foot deformities.  Reduced ability to feel temperature changes.  Muscle weakness.  Autonomic Neuropathy The symptoms of autonomic neuropathy vary depending on which nerves are affected. Symptoms may include:  Problems with digestion, such as: ? Feeling sick to your stomach (nausea). ? Vomiting. ? Bloating. ? Constipation. ? Diarrhea. ? Abdominal pain.  Difficulty with urination. This occurs if you lose your ability to sense when your bladder is full. Problems include: ? Urine leakage (incontinence). ? Inability to empty your bladder completely (retention).  Rapid or irregular heartbeat (palpitations).  Blood pressure drops when you stand up (orthostatic hypotension). When you stand up you may feel: ? Dizzy. ? Weak. ? Faint.  In men, inability to attain and maintain an erection.  In women, vaginal dryness and problems with decreased sexual desire and arousal.  Problems with body temperature regulation.  Increased or decreased sweating.  Focal Neuropathy  Abnormal eye movements or abnormal alignment of both eyes.  Weakness in the wrist.  Foot drop. This results in an inability to lift the foot properly and abnormal walking or foot movement.  Paralysis on one side of your face (Bell palsy).  Chest or abdominal pain. Radiculoplexus Neuropathy  Sudden, severe pain in your hip, thigh, or buttocks.  Weakness and wasting of thigh muscles.  Difficulty rising from a seated position.  Abdominal swelling.  Unexplained weight loss (usually more than 10 lb [4.5 kg]). How is   this diagnosed? Peripheral Neuropathy Your senses may be tested. Sensory function testing can be  done with:  A light touch using a monofilament.  A vibration with tuning fork.  A sharp sensation with a pin prick.  Other tests that can help diagnose neuropathy are:  Nerve conduction velocity. This test checks the transmission of an electrical current through a nerve.  Electromyography. This shows how muscles respond to electrical signals transmitted by nearby nerves.  Quantitative sensory testing. This is used to assess how your nerves respond to vibrations and changes in temperature.  Autonomic Neuropathy Diagnosis is often based on reported symptoms. Tell your health care provider if you experience:  Dizziness.  Constipation.  Diarrhea.  Inappropriate urination or inability to urinate.  Inability to get or maintain an erection.  Tests that may be done include:  Electrocardiography or Holter monitor. These are tests that can help show problems with the heart rate or heart rhythm.  An X-ray exam may be done.  Focal Neuropathy Diagnosis is made based on your symptoms and what your health care provider finds during your exam. Other tests may be done. They may include:  Nerve conduction velocities. This checks the transmission of electrical current through a nerve.  Electromyography. This shows how muscles respond to electrical signals transmitted by nearby nerves.  Quantitative sensory testing. This test is used to assess how your nerves respond to vibration and changes in temperature.  Radiculoplexus Neuropathy  Often the first thing is to eliminate any other issue or problems that might be the cause, as there is no standard test for diagnosis.  X-ray exam of your spine and lumbar region.  Spinal tap to rule out cancer.  MRI to rule out other lesions. How is this treated? Once nerve damage occurs, it cannot be reversed. The goal of treatment is to keep the disease or nerve damage from getting worse and affecting more nerve fibers. Controlling your blood  glucose level is the key. Most people with radiculoplexus neuropathy see at least a partial improvement over time. You will need to keep your blood glucose and HbA1c levels in the target range determined by your health care provider. Things that help control blood glucose levels include:  Blood glucose monitoring.  Meal planning.  Physical activity.  Diabetes medicine.  Over time, maintaining lower blood glucose levels helps lessen symptoms. Sometimes, prescription pain medicine is needed. Follow these instructions at home:  Do not smoke.  Keep your blood glucose level in the range that you and your health care provider have determined acceptable for you.  Keep your blood pressure level in the range that you and your health care provider have determined acceptable for you.  Eat a well-balanced diet.  Be physically active every day. Include strength training and balance exercises.  Protect your feet. ? Check your feet every day for sores, cuts, blisters, or signs of infection. ? Wear padded socks and supportive shoes. Use orthotic inserts, if necessary. ? Regularly check the insides of your shoes for worn spots. Make sure there are no rocks or other items inside your shoes before you put them on. Contact a health care provider if:  You have burning, stabbing, or aching pain in the legs or feet.  You are unable to feel pressure or pain in your feet.  You develop problems with digestion such as: ? Nausea. ? Vomiting. ? Bloating. ? Constipation. ? Diarrhea. ? Abdominal pain.  You have difficulty with urination, such as: ? Incontinence. ? Retention.    You have palpitations.  You develop orthostatic hypotension. When you stand up you may feel: ? Dizzy. ? Weak. ? Faint.  You cannot attain and maintain an erection (in men).  You have vaginal dryness and problems with decreased sexual desire and arousal (in women).  You have severe pain in your thighs, legs, or  buttocks.  You have unexplained weight loss. This information is not intended to replace advice given to you by your health care provider. Make sure you discuss any questions you have with your health care provider. Document Released: 08/18/2001 Document Revised: 11/15/2015 Document Reviewed: 11/18/2012 Elsevier Interactive Patient Education  2017 [employer]  

## 2016-09-10 ENCOUNTER — Telehealth: Payer: Self-pay

## 2016-09-10 ENCOUNTER — Other Ambulatory Visit: Payer: Self-pay

## 2016-09-10 DIAGNOSIS — F411 Generalized anxiety disorder: Secondary | ICD-10-CM

## 2016-09-10 MED ORDER — GLIPIZIDE 10 MG PO TABS: 10.0000 mg | ORAL_TABLET | Freq: Two times a day (BID) | ORAL | 0 refills | Status: DC

## 2016-09-10 MED ORDER — HYDROCHLOROTHIAZIDE 12.5 MG PO CAPS: 12.5000 mg | ORAL_CAPSULE | Freq: Every day | ORAL | 0 refills | Status: DC | PRN

## 2016-09-10 MED ORDER — CITALOPRAM HYDROBROMIDE 40 MG PO TABS
40.0000 mg | ORAL_TABLET | Freq: Every day | ORAL | 0 refills | Status: DC
Start: 2016-09-10 — End: 2016-10-28

## 2016-09-10 NOTE — Telephone Encounter (Signed)
Have her start taking the Neurontin 3 x day

## 2016-09-10 NOTE — Telephone Encounter (Signed)
Pt is aware as instructed--expressed understanding 

## 2016-09-10 NOTE — Telephone Encounter (Signed)
Pt left v/m; pt seen 09/08/16; pt was to cb if pain worsened in the daytime with diabetic nerve pain. The pain is almost unbearable and pt wants to know what can be done for daytime pain. Pt request cb. [place].

## 2016-09-15 NOTE — Telephone Encounter (Signed)
[facility] updated

## 2016-09-15 NOTE — Telephone Encounter (Signed)
Pt left v/m pt was taking the neurontin 3caps daily; still not helping pain. Pt wants to know what can take for pain. Pt request cb.

## 2016-09-15 NOTE — Telephone Encounter (Signed)
She is still on a very low dose. Max dose is 2700 mg/day. Have her increase to 2 tabs every 8 hours.

## 2016-09-17 ENCOUNTER — Other Ambulatory Visit: Payer: Self-pay | Admitting: Internal Medicine

## 2016-10-09 ENCOUNTER — Other Ambulatory Visit: Payer: Self-pay | Admitting: Internal Medicine

## 2016-10-27 ENCOUNTER — Other Ambulatory Visit: Payer: Self-pay | Admitting: Internal Medicine

## 2016-10-27 DIAGNOSIS — E119 Type 2 diabetes mellitus without complications: Secondary | ICD-10-CM

## 2016-10-28 ENCOUNTER — Other Ambulatory Visit: Payer: Self-pay

## 2016-10-28 DIAGNOSIS — F411 Generalized anxiety disorder: Secondary | ICD-10-CM

## 2016-10-28 MED ORDER — HYDROCHLOROTHIAZIDE 12.5 MG PO CAPS: 12.5000 mg | ORAL_CAPSULE | Freq: Every day | ORAL | 0 refills | Status: DC | PRN

## 2016-10-28 MED ORDER — GLIPIZIDE 10 MG PO TABS: 10.0000 mg | ORAL_TABLET | Freq: Two times a day (BID) | ORAL | 0 refills | Status: DC

## 2016-10-28 MED ORDER — CITALOPRAM HYDROBROMIDE 40 MG PO TABS: 40.0000 mg | ORAL_TABLET | Freq: Every day | ORAL | 0 refills | Status: DC

## 2016-11-03 ENCOUNTER — Other Ambulatory Visit: Payer: BLUE CROSS/BLUE SHIELD

## 2016-11-05 ENCOUNTER — Telehealth: Payer: Self-pay | Admitting: *Deleted

## 2016-11-05 NOTE — Telephone Encounter (Signed)
Patient left a voicemail stating that she had diabetes and has been treated for pain in her feet. Patient stated that now she is having pain in her hands and needs something to help with that. [facility]

## 2016-11-05 NOTE — Telephone Encounter (Signed)
The same medication she takes for the pain in her feet is the same medication she would take for pain in her hands. If it is not effective enough, she can increase the dose to 300 mg TID

## 2016-11-05 NOTE — Telephone Encounter (Signed)
Pt states she has already been taking 300mg  TID with no improvement-- please advise

## 2016-11-06 MED ORDER — GABAPENTIN 100 MG PO CAPS: 400.0000 mg | ORAL_CAPSULE | Freq: Three times a day (TID) | ORAL | 0 refills | Status: DC

## 2016-11-06 NOTE — Telephone Encounter (Signed)
Then she needs to keep going up on the dose. She can take 400 mg TID x 2 weeks. If no improvement, increase to 500 mg TID x 2 weeks. She can repeat this up until she is taking a max of 900 mg TID.

## 2016-11-06 NOTE — Addendum Note (Signed)
Addended by: Roena MaladyEVONTENNO, Jahki Witham Y on: 11/06/2016 04:59 PM   Modules accepted: Orders

## 2016-11-06 NOTE — Telephone Encounter (Signed)
Pt is aware as instructed--- I will send in refill with 400mg  TID sig... Advised pt she needs to keep us updated so we can update Rx... Pt expressed understanding

## 2016-11-10 MED ORDER — GABAPENTIN 100 MG PO CAPS: 400.0000 mg | ORAL_CAPSULE | Freq: Three times a day (TID) | ORAL | 0 refills | Status: DC

## 2016-11-10 NOTE — Telephone Encounter (Signed)
Pt left v/m ; pt went to pick up gabapentin and was advised pt cannot pick up until 11/29/16. Pt is taking last gabapentin today. Pt wants to know if new rx can be sent in wording instructions differently so pt can get gabapentin right away. Pt request cb. [facility].

## 2016-11-10 NOTE — Telephone Encounter (Signed)
Rx somehow went to [facility], pt is aware--- [facility] at [facility]

## 2016-11-10 NOTE — Addendum Note (Signed)
Addended by: Roena MaladyEVONTENNO, Branna Cortina Y on: 11/10/2016 05:12 PM   Modules accepted: Orders

## 2016-12-04 ENCOUNTER — Telehealth: Payer: Self-pay

## 2016-12-04 NOTE — Telephone Encounter (Signed)
Go ahead and change her to 600 mg caps, 1 cap TID # 90, 2 refills

## 2016-12-04 NOTE — Telephone Encounter (Signed)
Pt left v/m; pt was to cb if had to increase gabapentin; pt increased gabapentin 100 mg taking 5 capsules tid; previously pt had been taking 4 caps tid. Pt is still in a lot of pain with hands and feet and hoping increase in gabapentin will help.

## 2016-12-05 MED ORDER — GABAPENTIN 600 MG PO TABS: 600.0000 mg | ORAL_TABLET | Freq: Three times a day (TID) | ORAL | 2 refills | Status: DC

## 2016-12-05 NOTE — Addendum Note (Signed)
Addended by: Roena MaladyEVONTENNO, Shell Blanchette Y on: 12/05/2016 09:49 AM   Modules accepted: Orders

## 2016-12-05 NOTE — Telephone Encounter (Signed)
Pt is aware and new Rx sent to pharmacy as instructed

## 2016-12-10 ENCOUNTER — Other Ambulatory Visit: Payer: Self-pay | Admitting: Internal Medicine

## 2016-12-10 DIAGNOSIS — K219 Gastro-esophageal reflux disease without esophagitis: Secondary | ICD-10-CM

## 2016-12-11 NOTE — Telephone Encounter (Signed)
Last Rx 2016. Last f/u 08/2016

## 2016-12-30 ENCOUNTER — Encounter: Payer: Self-pay | Admitting: Internal Medicine

## 2016-12-30 ENCOUNTER — Ambulatory Visit (INDEPENDENT_AMBULATORY_CARE_PROVIDER_SITE_OTHER): Payer: BLUE CROSS/BLUE SHIELD | Admitting: Internal Medicine

## 2016-12-30 ENCOUNTER — Ambulatory Visit (INDEPENDENT_AMBULATORY_CARE_PROVIDER_SITE_OTHER)
Admission: RE | Admit: 2016-12-30 | Discharge: 2016-12-30 | Disposition: A | Discharge status: Home / Self Care | Payer: BLUE CROSS/BLUE SHIELD | Source: Ambulatory Visit | Attending: Internal Medicine | Admitting: Internal Medicine

## 2016-12-30 VITALS — BP 130/80 | HR 82 | Temp 97.9°F

## 2016-12-30 DIAGNOSIS — M25572 Pain in left ankle and joints of left foot: Secondary | ICD-10-CM

## 2016-12-30 MED ORDER — HYDROCODONE-ACETAMINOPHEN 5-325 MG PO TABS: 1.0000 | ORAL_TABLET | Freq: Three times a day (TID) | ORAL | 0 refills | Status: DC | PRN

## 2016-12-30 NOTE — Patient Instructions (Signed)
Elastic Bandage and RICE What does an elastic bandage do? Elastic bandages come in different shapes and sizes. They generally provide support to your injury and reduce swelling while you are healing, but they can perform different functions. Your health care provider will help you to decide what is best for your protection, recovery, or rehabilitation following an injury. What are some general tips for using an elastic bandage?  Use the bandage as directed by the maker of the bandage that you are using.  Do not wrap the bandage too tightly. This may cut off the circulation in the arm or leg in the area below the bandage. ? If part of your body beyond the bandage becomes blue, numb, cold, swollen, or is more painful, your bandage is most likely too tight. If this occurs, remove your bandage and reapply it more loosely.  See your health care provider if the bandage seems to be making your problems worse rather than better.  An elastic bandage should be removed and reapplied every 3-4 hours or as directed by your health care provider. What is RICE? The routine care of many injuries includes rest, ice, compression, and elevation (RICE therapy). Rest Rest is required to allow your body to heal. Generally, you can resume your routine activities when you are comfortable and have been given permission by your health care provider. Ice Icing your injury helps to keep the swelling down and it reduces pain. Do not apply ice directly to your skin.  Put ice in a plastic bag.  Place a towel between your skin and the bag.  Leave the ice on for 20 minutes, 2-3 times per day.  Do this for as long as you are directed by your health care provider. Compression Compression helps to keep swelling down, gives support, and helps with discomfort. Compression may be done with an elastic bandage. Elevation Elevation helps to reduce swelling and it decreases pain. If possible, your injured area should be placed at  or above the level of your heart or the center of your chest. When should I seek medical care? You should seek medical care if:  You have persistent pain and swelling.  Your symptoms are getting worse rather than improving.  These symptoms may indicate that further evaluation or further X-rays are needed. Sometimes, X-rays may not show a small broken bone (fracture) until a number of days later. Make a follow-up appointment with your health care provider. Ask when your X-ray results will be ready. Make sure that you get your X-ray results. When should I seek immediate medical care? You should seek immediate medical care if:  You have a sudden onset of severe pain at or below the area of your injury.  You develop redness or increased swelling around your injury.  You have tingling or numbness at or below the area of your injury that does not improve after you remove the elastic bandage.  This information is not intended to replace advice given to you by your health care provider. Make sure you discuss any questions you have with your health care provider. Document Released: 11/29/2001 Document Revised: 05/05/2016 Document Reviewed: 01/23/2014 Elsevier Interactive Patient Education  2017 [employer].  

## 2016-12-30 NOTE — Progress Notes (Signed)
Subjective:    Patient ID: Deborah Francis, female    DOB: Sep 21, 1980, 36 y.o.   MRN: [id number]  HPI  Pt presents to the clinic today with c/o left ankle pain. She reports she was walking in the grocery store 2 days ago, when all of a sudden she felt sharp pain in her left ankle. She immediately had difficulty walking. She describes the pain as sharp and throbbing. The pain radiates up the back of her leg. The area is tender but she has not noticed any swelling, redness or bruising. She is having difficulty bearing weight. She has been using some crutches her ex  Husband lent her. She has tried Ibuprofen with minimal relief.   Review of Systems      Past Medical History:  Diagnosis Date  . Allergy   . Anxiety   . Chicken pox   . Diabetes mellitus without complication ([place])   . Frequent headaches   . GERD (gastroesophageal reflux disease)   . Hyperlipidemia     Current Outpatient Prescriptions  Medication Sig Dispense Refill  . ALPRAZolam (XANAX) 0.5 MG tablet Take 1 tablet (0.5 mg total) by mouth daily as needed for anxiety. 20 tablet 0  . Blood Glucose Monitoring Suppl (ONE TOUCH ULTRA 2) W/DEVICE KIT 1 Device by Does not apply route one time only at 6 PM. 1 each 0  . citalopram (CELEXA) 40 MG tablet Take 1 tablet (40 mg total) by mouth daily. 90 tablet 0  . gabapentin (NEURONTIN) 600 MG tablet Take 1 tablet (600 mg total) by mouth 3 (three) times daily. 90 tablet 2  . glipiZIDE (GLUCOTROL) 10 MG tablet Take 1 tablet (10 mg total) by mouth 2 (two) times daily before a meal. 180 tablet 0  . glucose blood (ONE TOUCH TEST STRIPS) test strip 1 each by Other route 2 (two) times daily. Use as instructed 200 each 11  . hydrochlorothiazide (MICROZIDE) 12.5 MG capsule Take 1 capsule (12.5 mg total) by mouth daily as needed. 90 capsule 0  . ibuprofen (ADVIL,MOTRIN) 200 MG tablet Take 800 mg by mouth every 6 (six) hours as needed for moderate pain.    . Lancets (ONETOUCH ULTRASOFT) lancets 1  each by Other route 2 (two) times daily. Use as instructed 200 each 11  . metFORMIN (GLUCOPHAGE) 1000 MG tablet TAKE 1 TABLET BY MOUTH EVERY MORNING AND1.5 TABLETS AT NIGHT 75 tablet 5  . naproxen (NAPROSYN) 500 MG tablet Take 1 tablet (500 mg total) by mouth 2 (two) times daily with a meal. (Patient not taking: Reported on 09/08/2016) 30 tablet 2  . Norgestimate-Ethinyl Estradiol Triphasic (TRI-SPRINTEC) 0.18/0.215/0.25 MG-35 MCG tablet Take 1 tablet by mouth daily.    . pantoprazole (PROTONIX) 40 MG tablet TAKE 1 TABLET BY MOUTH DAILY 30 tablet 4  . Prenatal MV-Min-Fe Fum-FA-DHA (PRENATAL 1 PO) Take 1 capsule by mouth daily.    Marland Kitchen topiramate (TOPAMAX) 25 MG tablet Take 1 tablet (25 mg total) by mouth daily. 30 tablet 2   No current facility-administered medications for this visit.     No Known Allergies  Family History  Problem Relation Age of Onset  . Arthritis Mother   . Hyperlipidemia Mother   . Hypertension Mother   . Diabetes Mother   . Arthritis Father   . Cancer Father        prostate  . Hyperlipidemia Father   . Hypertension Father   . Diabetes Father   . Diabetes Sister   . Arthritis  Maternal Grandmother   . Diabetes Maternal Grandmother   . Arthritis Maternal Grandfather   . Diabetes Maternal Grandfather   . Arthritis Paternal Grandmother   . Hypertension Paternal Grandmother   . Diabetes Paternal Grandmother   . Arthritis Paternal Grandfather   . Stroke Paternal Grandfather   . Diabetes Paternal Grandfather     Social History   Social History  . Marital status: Married    Spouse name: N/A  . Number of children: N/A  . Years of education: N/A   Occupational History  . Not on file.   Social History Main Topics  . Smoking status: Never Smoker  . Smokeless tobacco: Never Used  . Alcohol use No  . Drug use: No  . Sexual activity: Yes   Other Topics Concern  . Not on file   Social History Narrative  . No narrative on file     Constitutional: Denies  fever, malaise, fatigue, headache or abrupt weight changes.  Musculoskeletal: Pt reports left ankle pain. Denies muscle pain or joint swelling.  Skin: Denies redness, rashes, lesions or ulcercations.   No other specific complaints in a complete review of systems (except as listed in HPI above).  Objective:   Physical Exam  BP 130/80   Pulse 82   Temp 97.9 F (36.6 C) (Oral)   SpO2 97%  Wt Readings from Last 3 Encounters:  09/08/16 (!) 361 lb (163.7 kg)  08/07/16 (!) 362 lb (164.2 kg)  10/15/15 (!) 366 lb (166 kg)    General: Appears her stated age, obese in NAD. Skin: Warm, dry and intact. No redness or warmth noted. Musculoskeletal: Decreased flexion, extension and rotation of the left ankle secondary to pain. No swelling noted. Pain with palpation over the lateral and medial malleolus, heel and achilles tendon. She can bear weight but reports it is very painful to do so.   BMET    Component Value Date/Time   NA 136 08/07/2016 1034   NA 135 (L) 10/17/2012 2223   K 4.4 08/07/2016 1034   K 4.2 10/17/2012 2223   CL 103 08/07/2016 1034   CL 100 10/17/2012 2223   CO2 28 08/07/2016 1034   CO2 25 10/17/2012 2223   GLUCOSE 104 (H) 08/07/2016 1034   GLUCOSE 383 (H) 10/17/2012 2223   BUN 12 08/07/2016 1034   BUN 7 10/17/2012 2223   CREATININE 0.78 08/07/2016 1034   CREATININE 0.77 10/17/2012 2223   CALCIUM 8.8 08/07/2016 1034   CALCIUM 8.8 10/17/2012 2223   GFRNONAA >90 02/26/2014 1253   GFRNONAA >60 10/17/2012 2223   GFRAA >90 02/26/2014 1253   GFRAA >60 10/17/2012 2223    Lipid Panel     Component Value Date/Time   CHOL 148 08/07/2016 1034   TRIG 165.0 (H) 08/07/2016 1034   HDL 40.70 08/07/2016 1034   CHOLHDL 4 08/07/2016 1034   VLDL 33.0 08/07/2016 1034   LDLCALC 74 08/07/2016 1034    CBC    Component Value Date/Time   WBC 9.4 10/15/2015 0943   RBC 4.74 10/15/2015 0943   HGB 10.9 (L) 10/15/2015 0943   HGB 12.9 10/17/2012 2223   HCT 34.1 (L) 10/15/2015 0943    HCT 39.5 10/17/2012 2223   PLT 262.0 10/15/2015 0943   PLT 234 10/17/2012 2223   MCV 72.1 (L) 10/15/2015 0943   MCV 77 (L) 10/17/2012 2223   MCH 25.1 (L) 02/26/2014 1253   MCHC 31.8 10/15/2015 0943   RDW 16.9 (H) 10/15/2015 6237  RDW 15.0 (H) 10/17/2012 2223    Hgb A1C Lab Results  Component Value Date   HGBA1C 7.6 (H) 08/07/2016            Assessment & Plan:   Left Ankle Pain:  Will obtain xray of left ankle today She was fitted for crutches that actually fit her Continue Ibuprofen RX for Norco every 8 hours as needed for severe pain  Will follow up after xray, stay off your foot for now. Webb Silversmith, NP

## 2017-01-07 ENCOUNTER — Other Ambulatory Visit: Payer: Self-pay | Admitting: Internal Medicine

## 2017-01-07 DIAGNOSIS — R51 Headache: Principal | ICD-10-CM

## 2017-01-07 DIAGNOSIS — R519 Headache, unspecified: Secondary | ICD-10-CM

## 2017-01-07 NOTE — Telephone Encounter (Signed)
Last filled 09/08/16... Please advise

## 2017-02-24 ENCOUNTER — Other Ambulatory Visit: Payer: Self-pay | Admitting: Internal Medicine

## 2017-02-24 DIAGNOSIS — R51 Headache: Principal | ICD-10-CM

## 2017-02-24 DIAGNOSIS — R519 Headache, unspecified: Secondary | ICD-10-CM

## 2017-03-26 ENCOUNTER — Telehealth: Payer: Self-pay

## 2017-03-26 ENCOUNTER — Ambulatory Visit: Payer: BLUE CROSS/BLUE SHIELD | Admitting: Internal Medicine

## 2017-03-26 ENCOUNTER — Other Ambulatory Visit: Payer: Self-pay | Admitting: Internal Medicine

## 2017-03-26 DIAGNOSIS — F411 Generalized anxiety disorder: Secondary | ICD-10-CM

## 2017-03-26 NOTE — Telephone Encounter (Signed)
Noted, agreed, she has to be seen

## 2017-03-26 NOTE — Telephone Encounter (Signed)
Pt is sure she has UTI; advised would need appt for eval; pt voiced understanding but no longer has ins and pt has medicaid, [place] access and is assigned to a physician in [place]. Advised pt she does need to ck with [place] access physician. Pt voiced understanding. FYI to Pamala Hurry NP.

## 2017-03-30 ENCOUNTER — Ambulatory Visit (INDEPENDENT_AMBULATORY_CARE_PROVIDER_SITE_OTHER): Payer: BLUE CROSS/BLUE SHIELD | Admitting: Internal Medicine

## 2017-03-30 ENCOUNTER — Encounter: Payer: Self-pay | Admitting: Internal Medicine

## 2017-03-30 VITALS — BP 140/78 | HR 70 | Temp 97.9°F | Wt 370.0 lb

## 2017-03-30 DIAGNOSIS — N3000 Acute cystitis without hematuria: Secondary | ICD-10-CM | POA: Diagnosis not present

## 2017-03-30 DIAGNOSIS — R3915 Urgency of urination: Secondary | ICD-10-CM | POA: Diagnosis not present

## 2017-03-30 DIAGNOSIS — R350 Frequency of micturition: Secondary | ICD-10-CM | POA: Diagnosis not present

## 2017-03-30 DIAGNOSIS — R300 Dysuria: Secondary | ICD-10-CM | POA: Diagnosis not present

## 2017-03-30 MED ORDER — NITROFURANTOIN MONOHYD MACRO 100 MG PO CAPS: 100.0000 mg | ORAL_CAPSULE | Freq: Two times a day (BID) | ORAL | 0 refills | Status: DC

## 2017-03-30 MED ORDER — TOPIRAMATE 25 MG PO TABS: 25.0000 mg | ORAL_TABLET | Freq: Every day | ORAL | 2 refills | Status: AC

## 2017-03-30 MED ORDER — GABAPENTIN 600 MG PO TABS: 600.0000 mg | ORAL_TABLET | Freq: Three times a day (TID) | ORAL | 2 refills | Status: DC

## 2017-03-30 NOTE — Patient Instructions (Signed)

## 2017-03-30 NOTE — Progress Notes (Signed)
HPI  Pt presents to the clinic today with c/o urinary urgency, frequency, dysuria and low back pain. This started 1 week ago. She denies fever, chills or nausea. She denies vaginal complaints or abnormal bleeding. She has not tried anything OTC for her symptoms.   Review of Systems  Past Medical History:  Diagnosis Date  . Allergy   . Anxiety   . Chicken pox   . Diabetes mellitus without complication ([facility])   . Frequent headaches   . GERD (gastroesophageal reflux disease)   . Hyperlipidemia     Family History  Problem Relation Age of Onset  . Arthritis Mother   . Hyperlipidemia Mother   . Hypertension Mother   . Diabetes Mother   . Arthritis Father   . Cancer Father        prostate  . Hyperlipidemia Father   . Hypertension Father   . Diabetes Father   . Diabetes Sister   . Arthritis Maternal Grandmother   . Diabetes Maternal Grandmother   . Arthritis Maternal Grandfather   . Diabetes Maternal Grandfather   . Arthritis Paternal Grandmother   . Hypertension Paternal Grandmother   . Diabetes Paternal Grandmother   . Arthritis Paternal Grandfather   . Stroke Paternal Grandfather   . Diabetes Paternal Grandfather     Social History   Social History  . Marital status: Married    Spouse name: N/A  . Number of children: N/A  . Years of education: N/A   Occupational History  . Not on file.   Social History Main Topics  . Smoking status: Never Smoker  . Smokeless tobacco: Never Used  . Alcohol use No  . Drug use: No  . Sexual activity: Yes   Other Topics Concern  . Not on file   Social History Narrative  . No narrative on file    No Known Allergies   Constitutional: Denies fever, malaise, fatigue, headache or abrupt weight changes.   GU: Pt reports urgency, frequency and pain with urination. Denies burning sensation, blood in urine, odor or discharge. Skin: Denies redness, rashes, lesions or ulcercations.   No other specific complaints in a complete  review of systems (except as listed in HPI above).    Objective:   Physical Exam  BP 140/78   Pulse 70   Temp 97.9 F (36.6 C) (Oral)   Wt (!) 370 lb (167.8 kg)   LMP 03/04/2017   BMI 55.85 kg/m    Wt Readings from Last 3 Encounters:  09/08/16 (!) 361 lb (163.7 kg)  08/07/16 (!) 362 lb (164.2 kg)  10/15/15 (!) 366 lb (166 kg)    General: Appears her stated age, in NAD. Abdomen: Soft. Normal bowel sounds. No distention or masses noted.  Tender to palpation over the bladder area. No CVA tenderness.       Assessment & Plan:   Urgency, Frequency, Dysuria secondary to UTI:  Urinalysis: 3+ leuks, trace blood Will send urine culture eRx sent if for Macrobid 100 mg BID x 5 days OK to take AZO OTC Drink plenty of fluids  RTC as needed or if symptoms persist. Nicki Reaper, NP

## 2017-03-31 LAB — POC URINALSYSI DIPSTICK (AUTOMATED)
Glucose, UA: NEGATIVE
NITRITE UA: NEGATIVE
Spec Grav, UA: 1.025 (ref 1.010–1.025)
pH, UA: 6 (ref 5.0–8.0)

## 2017-04-02 LAB — URINE CULTURE
MICRO NUMBER: 81123495
SPECIMEN QUALITY: ADEQUATE

## 2017-06-30 ENCOUNTER — Ambulatory Visit: Payer: BLUE CROSS/BLUE SHIELD | Admitting: Adult Health

## 2018-07-24 HISTORY — PX: GASTRIC BYPASS: SHX52

## 2019-08-12 NOTE — Progress Notes (Signed)
PCP - Nicki Reaper Cardiologist -   Chest x-ray -  EKG -  Stress Test -  ECHO -  Cardiac Cath -   Sleep Study -  CPAP -   Fasting Blood Sugar -  Checks Blood Sugar _____ times a day  Blood Thinner Instructions: Aspirin Instructions: Last Dose:  Anesthesia review:   Patient denies shortness of breath, fever, cough and chest pain at PAT appointment   Patient verbalized understanding of instructions that were given to them at the PAT appointment. Patient was also instructed that they will need to review over the PAT instructions again at home before surgery.

## 2019-08-12 NOTE — Patient Instructions (Addendum)
Deborah Francis  08/12/2019      Please remember that your COVID test is scheduled on 08-22-19 @9 :40 AM at the [facility] [address] [place], [place]. You will begin your quarantine after  this test. A handout with your quarantine instructions will be given to you that day.    Your procedure is scheduled on 08-25-19   Report to [facility]  at  5:30 A.M.   Call this number if you have problems the morning of surgery:[phone]   OUR ADDRESS IS [address], WE ARE LOCATED [facility] THE [facility] WITH [facility].   Remember: Do not eat food or drink liquids after midnight.   Take these medicines the morning of surgery with A SIP OF WATER: Clonazepam (Klonopin), Pantoprazole (Protonix), Sertraline (Zoloft), and Topiramate (Topamax)   Do not wear jewelry, make-up or nail polish.   Do not wear lotions, powders, or perfumes, or deoderant.   Do not shave 48 hours prior to surgery.     Do not bring valuables to the hospital.   [facility] is not responsible for any belongings or valuables.  Contacts, dentures or bridgework may not be worn into surgery.  Leave your suitcase in the car.  After surgery it may be brought to your room.  For patients admitted to the hospital, discharge time will be determined by your treatment team.  Patients discharged the day of surgery will not be allowed to drive home.   Special instructions: Please bring your medications in their original pill bottle   Please read over the following fact sheets that you were given:       [facility] - Preparing for Surgery Before surgery, you can play an important role.  Because skin is not sterile, your skin needs to be as free of germs as possible.  You can reduce the number of germs on your skin by washing with CHG (chlorahexidine gluconate) soap before surgery.  CHG is an antiseptic cleaner which kills germs and bonds with the skin to continue killing germs even  after washing. Please DO NOT use if you have an allergy to CHG or antibacterial soaps.  If your skin becomes reddened/irritated stop using the CHG and inform your nurse when you arrive at Short Stay. Do not shave (including legs and underarms) for at least 48 hours prior to the first CHG shower.  You may shave your face/neck. Please follow these instructions carefully:  1.  Shower with CHG Soap the night before surgery and the  morning of Surgery.  2.  If you choose to wash your hair, wash your hair first as usual with your  normal  shampoo.  3.  After you shampoo, rinse your hair and body thoroughly to remove the  shampoo.                           4.  Use CHG as you would any other liquid soap.  You can apply chg directly  to the skin and wash                       Gently with a scrungie or clean washcloth.  5.  Apply the CHG Soap to your body ONLY FROM THE NECK DOWN.   Do not use on face/ open  Wound or open sores. Avoid contact with eyes, ears mouth and genitals (private parts).                       Wash face,  Genitals (private parts) with your normal soap.             6.  Wash thoroughly, paying special attention to the area where your surgery  will be performed.  7.  Thoroughly rinse your body with warm water from the neck down.  8.  DO NOT shower/wash with your normal soap after using and rinsing off  the CHG Soap.                9.  Pat yourself dry with a clean towel.            10.  Wear clean pajamas.            11.  Place clean sheets on your bed the night of your first shower and do not  sleep with pets. Day of Surgery : Do not apply any lotions/deodorants the morning of surgery.  Please wear clean clothes to the hospital/surgery center.  FAILURE TO FOLLOW THESE INSTRUCTIONS MAY RESULT IN THE CANCELLATION OF YOUR SURGERY PATIENT SIGNATURE_________________________________  NURSE  SIGNATURE__________________________________  ________________________________________________________________________

## 2019-08-17 ENCOUNTER — Other Ambulatory Visit: Payer: Self-pay

## 2019-08-17 ENCOUNTER — Encounter (HOSPITAL_COMMUNITY): Payer: Self-pay

## 2019-08-17 ENCOUNTER — Encounter (HOSPITAL_COMMUNITY)
Admission: RE | Admit: 2019-08-17 | Discharge: 2019-08-17 | Disposition: A | Discharge status: Home / Self Care | Payer: Medicaid Other | Source: Ambulatory Visit | Attending: Obstetrics and Gynecology | Admitting: Obstetrics and Gynecology

## 2019-08-17 DIAGNOSIS — Z01810 Encounter for preprocedural cardiovascular examination: Secondary | ICD-10-CM | POA: Diagnosis not present

## 2019-08-17 DIAGNOSIS — Z01812 Encounter for preprocedural laboratory examination: Secondary | ICD-10-CM | POA: Insufficient documentation

## 2019-08-17 DIAGNOSIS — R9431 Abnormal electrocardiogram [ECG] [EKG]: Secondary | ICD-10-CM | POA: Diagnosis not present

## 2019-08-17 DIAGNOSIS — E119 Type 2 diabetes mellitus without complications: Secondary | ICD-10-CM | POA: Insufficient documentation

## 2019-08-17 HISTORY — DX: Type 2 diabetes mellitus with diabetic neuropathy, unspecified: E11.40

## 2019-08-17 LAB — CBC
HCT: 39.7 % (ref 36.0–46.0)
Hemoglobin: 12.4 g/dL (ref 12.0–15.0)
MCH: 25.9 pg — ABNORMAL LOW (ref 26.0–34.0)
MCHC: 31.2 g/dL (ref 30.0–36.0)
MCV: 82.9 fL (ref 80.0–100.0)
Platelets: 219 10*3/uL (ref 150–400)
RBC: 4.79 MIL/uL (ref 3.87–5.11)
RDW: 14.5 % (ref 11.5–15.5)
WBC: 10.3 10*3/uL (ref 4.0–10.5)
nRBC: 0 % (ref 0.0–0.2)

## 2019-08-17 LAB — GLUCOSE, CAPILLARY: Glucose-Capillary: 145 mg/dL — ABNORMAL HIGH (ref 70–99)

## 2019-08-17 LAB — HEMOGLOBIN A1C
Hgb A1c MFr Bld: 7 % — ABNORMAL HIGH (ref 4.8–5.6)
Mean Plasma Glucose: 154.2 mg/dL

## 2019-08-22 ENCOUNTER — Other Ambulatory Visit (HOSPITAL_COMMUNITY)
Admission: RE | Admit: 2019-08-22 | Discharge: 2019-08-22 | Disposition: A | Discharge status: Home / Self Care | Payer: Medicaid Other | Source: Ambulatory Visit | Attending: Obstetrics and Gynecology | Admitting: Obstetrics and Gynecology

## 2019-08-22 DIAGNOSIS — Z20822 Contact with and (suspected) exposure to covid-19: Secondary | ICD-10-CM | POA: Insufficient documentation

## 2019-08-22 DIAGNOSIS — Z01812 Encounter for preprocedural laboratory examination: Secondary | ICD-10-CM | POA: Diagnosis not present

## 2019-08-22 LAB — SARS CORONAVIRUS 2 (TAT 6-24 HRS): SARS Coronavirus 2: NEGATIVE

## 2019-08-24 ENCOUNTER — Encounter (HOSPITAL_BASED_OUTPATIENT_CLINIC_OR_DEPARTMENT_OTHER): Payer: Self-pay | Admitting: Obstetrics and Gynecology

## 2019-08-24 MED ORDER — DEXTROSE 5 % IV SOLN
3.0000 g | INTRAVENOUS | Status: AC
  Administered 2019-08-25: 3 g via INTRAVENOUS
  Filled 2019-08-24 (×2): qty 3000

## 2019-08-24 NOTE — Anesthesia Preprocedure Evaluation (Addendum)
Anesthesia Evaluation  Patient identified by MRN, date of birth, ID band Patient awake    Reviewed: Allergy & Precautions, NPO status , Patient's Chart, lab work & pertinent test results  Airway Mallampati: III  TM Distance: >3 FB Neck ROM: Full    Dental no notable dental hx. (+) Teeth Intact   Pulmonary former smoker,    Pulmonary exam normal breath sounds clear to auscultation       Cardiovascular negative cardio ROS Normal cardiovascular exam Rhythm:Regular Rate:Normal     Neuro/Psych  Headaches, PSYCHIATRIC DISORDERS Anxiety Diabetic peripheral neuropathy    GI/Hepatic Neg liver ROS, GERD  Medicated and Controlled,  Endo/Other  diabetes, Poorly Controlled, Type obesity  Renal/GU negative Renal ROS  negative genitourinary   Musculoskeletal negative musculoskeletal ROS (+)   Abdominal (+) + obese,   Peds  Hematology   Anesthesia Other Findings   Reproductive/Obstetrics AUB Pelvic pain                           Anesthesia Physical Anesthesia Plan  ASA: III  Anesthesia Plan: General   Post-op Pain Management:    Induction: Intravenous  PONV Risk Score and Plan: 4 or greater and Scopolamine patch - Pre-op, Midazolam, Treatment may vary due to age or medical condition, Dexamethasone and Ondansetron  Airway Management Planned: Oral ETT  Additional Equipment:   Intra-op Plan:   Post-operative Plan: Extubation in OR  Informed Consent: I have reviewed the patients History and Physical, chart, labs and discussed the procedure including the risks, benefits and alternatives for the proposed anesthesia with the patient or authorized representative who has indicated his/her understanding and acceptance.     Dental advisory given  Plan Discussed with: CRNA and Surgeon  Anesthesia Plan Comments:        Anesthesia Quick Evaluation

## 2019-08-24 NOTE — H&P (Signed)
Deborah Francis is an 39 y.o. female. 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 She has had bleeding every 2 weeks for the past several months, no missed or late pills. Has also had pelvic pain recently, feels like what her mom and sisters say it felt like when they had endometriosis. Family hx endometriosis and fibroids. They don't want any more children, would like definitive therapy. Pelvic u/s in November normal except for 4 cm simple right ovarian cyst. 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  Pertinent Gynecological History: OB History: G2, P1011 LTCS   Menstrual History: No LMP recorded.    Past Medical History:  Diagnosis Date  . Allergy   . Anxiety   . Chicken pox   . Diabetes mellitus without complication ([facility])   . Diabetic neuropathy ([facility])   . Diabetic neuropathy ([facility])   . Frequent headaches   . GERD (gastroesophageal reflux disease)   . Hyperlipidemia     Past Surgical History:  Procedure Laterality Date  . CESAREAN SECTION    . GASTRIC BYPASS  07/2018  . TONSILLECTOMY      Family History  Problem Relation Age of Onset  . Arthritis Mother   . Hyperlipidemia Mother   . Hypertension Mother   . Diabetes Mother   . Arthritis Father   . Cancer Father        prostate  . Hyperlipidemia Father   . Hypertension Father   . Diabetes Father   . Diabetes Sister   . Arthritis Maternal Grandmother   . Diabetes Maternal Grandmother   . Arthritis Maternal Grandfather   . Diabetes Maternal Grandfather   . Arthritis Paternal Grandmother   . Hypertension Paternal Grandmother   . Diabetes Paternal Grandmother   . Arthritis Paternal Grandfather   . Stroke Paternal Grandfather   . Diabetes Paternal Grandfather     Social History:  reports that she has quit smoking. Her smoking use included e-cigarettes. She has never used smokeless tobacco. She reports that she does not drink alcohol or use drugs.  Allergies: No Known Allergies  No  medications prior to admission.    Review of Systems  Respiratory: Negative.   Cardiovascular: Negative.     There were no vitals taken for this visit. Physical Exam  Constitutional: She appears well-developed and well-nourished.  Cardiovascular: Normal rate, regular rhythm and normal heart sounds.  No murmur heard. Respiratory: Breath sounds normal. No respiratory distress. She has no wheezes.  GI: Soft. She exhibits no distension and no mass. There is no abdominal tenderness.  Genitourinary:    Vagina and uterus normal.     Genitourinary Comments: No adnexal mass Has several small condyloma     No results found for this or any previous visit (from the past 24 hour(s)).  No results found.  Assessment/Plan: AUB, pelvic pain, CIN I, condyloma.  Normal u/s, bleeding currently improved with daily aygestin, she wants definitive surgical therapy.  Discussed all medical and surgical options.  Discussed surgical procedure, risks, chances of relieving symptoms.  Will admit for TLH, bilateral salpingectomy, possible cystoscopy, removal of condyloma  Leighton Roach Carey Lafon 08/24/2019, 8:08 PM

## 2019-08-25 ENCOUNTER — Encounter (HOSPITAL_BASED_OUTPATIENT_CLINIC_OR_DEPARTMENT_OTHER): Payer: Self-pay | Admitting: Obstetrics and Gynecology

## 2019-08-25 ENCOUNTER — Ambulatory Visit (HOSPITAL_BASED_OUTPATIENT_CLINIC_OR_DEPARTMENT_OTHER): Payer: Medicaid Other | Admitting: Physician Assistant

## 2019-08-25 ENCOUNTER — Ambulatory Visit (HOSPITAL_BASED_OUTPATIENT_CLINIC_OR_DEPARTMENT_OTHER): Payer: Medicaid Other | Admitting: Anesthesiology

## 2019-08-25 ENCOUNTER — Encounter (HOSPITAL_BASED_OUTPATIENT_CLINIC_OR_DEPARTMENT_OTHER)
Admission: RE | Disposition: A | Discharge status: Home / Self Care | Payer: Self-pay | Source: Home / Self Care | Attending: Obstetrics and Gynecology

## 2019-08-25 ENCOUNTER — Other Ambulatory Visit: Payer: Self-pay

## 2019-08-25 ENCOUNTER — Ambulatory Visit (HOSPITAL_BASED_OUTPATIENT_CLINIC_OR_DEPARTMENT_OTHER)
Admission: RE | Admit: 2019-08-25 | Discharge: 2019-08-25 | Disposition: A | Discharge status: Home / Self Care | Payer: Medicaid Other | Attending: Obstetrics and Gynecology | Admitting: Obstetrics and Gynecology

## 2019-08-25 DIAGNOSIS — Z87891 Personal history of nicotine dependence: Secondary | ICD-10-CM | POA: Diagnosis not present

## 2019-08-25 DIAGNOSIS — F419 Anxiety disorder, unspecified: Secondary | ICD-10-CM | POA: Insufficient documentation

## 2019-08-25 DIAGNOSIS — E1140 Type 2 diabetes mellitus with diabetic neuropathy, unspecified: Secondary | ICD-10-CM | POA: Insufficient documentation

## 2019-08-25 DIAGNOSIS — K219 Gastro-esophageal reflux disease without esophagitis: Secondary | ICD-10-CM | POA: Insufficient documentation

## 2019-08-25 DIAGNOSIS — Z833 Family history of diabetes mellitus: Secondary | ICD-10-CM | POA: Insufficient documentation

## 2019-08-25 DIAGNOSIS — R102 Pelvic and perineal pain: Secondary | ICD-10-CM | POA: Insufficient documentation

## 2019-08-25 DIAGNOSIS — Z9884 Bariatric surgery status: Secondary | ICD-10-CM | POA: Insufficient documentation

## 2019-08-25 DIAGNOSIS — N939 Abnormal uterine and vaginal bleeding, unspecified: Secondary | ICD-10-CM | POA: Diagnosis present

## 2019-08-25 DIAGNOSIS — A630 Anogenital (venereal) warts: Secondary | ICD-10-CM | POA: Insufficient documentation

## 2019-08-25 DIAGNOSIS — Z79899 Other long term (current) drug therapy: Secondary | ICD-10-CM | POA: Diagnosis not present

## 2019-08-25 DIAGNOSIS — N838 Other noninflammatory disorders of ovary, fallopian tube and broad ligament: Secondary | ICD-10-CM | POA: Diagnosis not present

## 2019-08-25 DIAGNOSIS — Z793 Long term (current) use of hormonal contraceptives: Secondary | ICD-10-CM | POA: Diagnosis not present

## 2019-08-25 DIAGNOSIS — Z90710 Acquired absence of both cervix and uterus: Secondary | ICD-10-CM | POA: Diagnosis present

## 2019-08-25 HISTORY — PX: CONDYLOMA EXCISION/FULGURATION: SHX1389

## 2019-08-25 HISTORY — PX: TOTAL LAPAROSCOPIC HYSTERECTOMY WITH SALPINGECTOMY: SHX6742

## 2019-08-25 LAB — TYPE AND SCREEN
ABO/RH(D): A POS
Antibody Screen: NEGATIVE

## 2019-08-25 LAB — GLUCOSE, CAPILLARY
Glucose-Capillary: 157 mg/dL — ABNORMAL HIGH (ref 70–99)
Glucose-Capillary: 188 mg/dL — ABNORMAL HIGH (ref 70–99)
Glucose-Capillary: 219 mg/dL — ABNORMAL HIGH (ref 70–99)
Glucose-Capillary: 285 mg/dL — ABNORMAL HIGH (ref 70–99)

## 2019-08-25 LAB — POCT PREGNANCY, URINE: Preg Test, Ur: NEGATIVE

## 2019-08-25 SURGERY — HYSTERECTOMY, TOTAL, LAPAROSCOPIC, WITH SALPINGECTOMY
Anesthesia: General | Site: Vagina

## 2019-08-25 MED ORDER — LACTATED RINGERS IV SOLN
INTRAVENOUS | Status: DC
  Filled 2019-08-25 (×2): qty 1000

## 2019-08-25 MED ORDER — BUPIVACAINE HCL 0.5 % IJ SOLN
INTRAMUSCULAR | Status: DC | PRN
  Administered 2019-08-25: 10 mL

## 2019-08-25 MED ORDER — INSULIN ASPART 100 UNIT/ML ~~LOC~~ SOLN
0.0000 [IU] | SUBCUTANEOUS | Status: DC
  Administered 2019-08-25: 11 [IU] via SUBCUTANEOUS
  Administered 2019-08-25: 7 [IU] via SUBCUTANEOUS
  Filled 2019-08-25: qty 0.2

## 2019-08-25 MED ORDER — AMITRIPTYLINE HCL 25 MG PO TABS
25.0000 mg | ORAL_TABLET | Freq: Every day | ORAL | Status: DC
  Filled 2019-08-25: qty 1

## 2019-08-25 MED ORDER — ONDANSETRON HCL 4 MG/2ML IJ SOLN
INTRAMUSCULAR | Status: AC
  Filled 2019-08-25: qty 2

## 2019-08-25 MED ORDER — DEXAMETHASONE SODIUM PHOSPHATE 4 MG/ML IJ SOLN
INTRAMUSCULAR | Status: DC | PRN
  Administered 2019-08-25: 10 mg via INTRAVENOUS

## 2019-08-25 MED ORDER — KETOROLAC TROMETHAMINE 30 MG/ML IJ SOLN
INTRAMUSCULAR | Status: AC
  Filled 2019-08-25: qty 1

## 2019-08-25 MED ORDER — IBUPROFEN 800 MG PO TABS
800.0000 mg | ORAL_TABLET | Freq: Four times a day (QID) | ORAL | Status: DC
  Filled 2019-08-25: qty 1

## 2019-08-25 MED ORDER — FERRIC SUBSULFATE 259 MG/GM EX SOLN
CUTANEOUS | Status: AC
  Filled 2019-08-25: qty 8

## 2019-08-25 MED ORDER — DEXTROSE-NACL 5-0.45 % IV SOLN
INTRAVENOUS | Status: DC
  Filled 2019-08-25: qty 1000

## 2019-08-25 MED ORDER — BUPIVACAINE HCL (PF) 0.5 % IJ SOLN
INTRAMUSCULAR | Status: AC
  Filled 2019-08-25: qty 30

## 2019-08-25 MED ORDER — MIDAZOLAM HCL 5 MG/5ML IJ SOLN
INTRAMUSCULAR | Status: DC | PRN
  Administered 2019-08-25: 2 mg via INTRAVENOUS

## 2019-08-25 MED ORDER — OXYCODONE HCL 5 MG PO TABS
ORAL_TABLET | ORAL | Status: AC
  Filled 2019-08-25: qty 1

## 2019-08-25 MED ORDER — PROPOFOL 10 MG/ML IV BOLUS
INTRAVENOUS | Status: AC
  Filled 2019-08-25: qty 40

## 2019-08-25 MED ORDER — BUPIVACAINE HCL (PF) 0.25 % IJ SOLN
INTRAMUSCULAR | Status: DC | PRN
  Administered 2019-08-25: 20 mL

## 2019-08-25 MED ORDER — ACETAMINOPHEN 500 MG PO TABS
1000.0000 mg | ORAL_TABLET | Freq: Four times a day (QID) | ORAL | Status: DC
  Administered 2019-08-25: 1000 mg via ORAL
  Filled 2019-08-25: qty 2

## 2019-08-25 MED ORDER — ALUM & MAG HYDROXIDE-SIMETH 200-200-20 MG/5ML PO SUSP
30.0000 mL | ORAL | Status: DC | PRN
  Filled 2019-08-25: qty 30

## 2019-08-25 MED ORDER — INSULIN ASPART 100 UNIT/ML ~~LOC~~ SOLN
SUBCUTANEOUS | Status: AC
  Filled 2019-08-25: qty 1

## 2019-08-25 MED ORDER — KETOROLAC TROMETHAMINE 30 MG/ML IJ SOLN
30.0000 mg | Freq: Once | INTRAMUSCULAR | Status: DC
  Filled 2019-08-25: qty 1

## 2019-08-25 MED ORDER — SIMETHICONE 80 MG PO CHEW
80.0000 mg | CHEWABLE_TABLET | Freq: Four times a day (QID) | ORAL | Status: DC | PRN
  Filled 2019-08-25: qty 1

## 2019-08-25 MED ORDER — HYDROMORPHONE HCL 1 MG/ML IJ SOLN
INTRAMUSCULAR | Status: AC
  Filled 2019-08-25: qty 1

## 2019-08-25 MED ORDER — BUPIVACAINE HCL (PF) 0.25 % IJ SOLN
INTRAMUSCULAR | Status: AC
  Filled 2019-08-25: qty 30

## 2019-08-25 MED ORDER — CEFAZOLIN SODIUM-DEXTROSE 1-4 GM/50ML-% IV SOLN
INTRAVENOUS | Status: AC
  Filled 2019-08-25: qty 50

## 2019-08-25 MED ORDER — CLONAZEPAM 0.5 MG PO TABS
0.5000 mg | ORAL_TABLET | Freq: Two times a day (BID) | ORAL | Status: DC
  Filled 2019-08-25: qty 1

## 2019-08-25 MED ORDER — GABAPENTIN 300 MG PO CAPS
ORAL_CAPSULE | ORAL | Status: AC
  Filled 2019-08-25: qty 1

## 2019-08-25 MED ORDER — PANTOPRAZOLE SODIUM 40 MG PO TBEC
40.0000 mg | DELAYED_RELEASE_TABLET | Freq: Every day | ORAL | Status: DC
  Filled 2019-08-25: qty 1

## 2019-08-25 MED ORDER — ROCURONIUM BROMIDE 10 MG/ML (PF) SYRINGE
PREFILLED_SYRINGE | INTRAVENOUS | Status: AC
  Filled 2019-08-25: qty 10

## 2019-08-25 MED ORDER — MIDAZOLAM HCL 2 MG/2ML IJ SOLN
INTRAMUSCULAR | Status: AC
  Filled 2019-08-25: qty 2

## 2019-08-25 MED ORDER — CEFAZOLIN SODIUM-DEXTROSE 2-4 GM/100ML-% IV SOLN
INTRAVENOUS | Status: AC
  Filled 2019-08-25: qty 100

## 2019-08-25 MED ORDER — ONDANSETRON HCL 4 MG PO TABS
4.0000 mg | ORAL_TABLET | Freq: Four times a day (QID) | ORAL | Status: DC | PRN
  Filled 2019-08-25: qty 1

## 2019-08-25 MED ORDER — ACETAMINOPHEN 500 MG PO TABS
ORAL_TABLET | ORAL | Status: AC
  Filled 2019-08-25: qty 2

## 2019-08-25 MED ORDER — KETOROLAC TROMETHAMINE 30 MG/ML IJ SOLN
30.0000 mg | Freq: Four times a day (QID) | INTRAMUSCULAR | Status: DC
  Administered 2019-08-25: 30 mg via INTRAVENOUS
  Filled 2019-08-25: qty 1

## 2019-08-25 MED ORDER — FENTANYL CITRATE (PF) 100 MCG/2ML IJ SOLN
INTRAMUSCULAR | Status: DC | PRN
  Administered 2019-08-25 (×2): 50 ug via INTRAVENOUS
  Administered 2019-08-25: 100 ug via INTRAVENOUS
  Administered 2019-08-25 (×2): 25 ug via INTRAVENOUS
  Administered 2019-08-25 (×2): 50 ug via INTRAVENOUS

## 2019-08-25 MED ORDER — KETOROLAC TROMETHAMINE 30 MG/ML IJ SOLN
INTRAMUSCULAR | Status: DC | PRN
  Administered 2019-08-25: 30 mg via INTRAVENOUS

## 2019-08-25 MED ORDER — DEXAMETHASONE SODIUM PHOSPHATE 10 MG/ML IJ SOLN
INTRAMUSCULAR | Status: AC
  Filled 2019-08-25: qty 1

## 2019-08-25 MED ORDER — EPHEDRINE 5 MG/ML INJ
INTRAVENOUS | Status: AC
  Filled 2019-08-25: qty 10

## 2019-08-25 MED ORDER — FENTANYL CITRATE (PF) 100 MCG/2ML IJ SOLN
INTRAMUSCULAR | Status: AC
  Filled 2019-08-25: qty 2

## 2019-08-25 MED ORDER — TOPIRAMATE 25 MG PO TABS
50.0000 mg | ORAL_TABLET | Freq: Two times a day (BID) | ORAL | Status: DC
  Filled 2019-08-25: qty 2

## 2019-08-25 MED ORDER — MEPERIDINE HCL 25 MG/ML IJ SOLN
6.2500 mg | INTRAMUSCULAR | Status: DC | PRN
  Filled 2019-08-25: qty 1

## 2019-08-25 MED ORDER — METOCLOPRAMIDE HCL 5 MG/ML IJ SOLN
10.0000 mg | Freq: Once | INTRAMUSCULAR | Status: DC | PRN
  Filled 2019-08-25: qty 2

## 2019-08-25 MED ORDER — PREGABALIN 75 MG PO CAPS
150.0000 mg | ORAL_CAPSULE | Freq: Two times a day (BID) | ORAL | Status: DC
  Administered 2019-08-25: 150 mg via ORAL
  Filled 2019-08-25: qty 2

## 2019-08-25 MED ORDER — OXYCODONE HCL 5 MG PO TABS: 5.0000 mg | ORAL_TABLET | ORAL | 0 refills | Status: AC | PRN

## 2019-08-25 MED ORDER — SERTRALINE HCL 50 MG PO TABS
150.0000 mg | ORAL_TABLET | Freq: Every day | ORAL | Status: DC
  Filled 2019-08-25: qty 1

## 2019-08-25 MED ORDER — SODIUM CHLORIDE 0.9 % IR SOLN
Status: DC | PRN
  Administered 2019-08-25: 3000 mL

## 2019-08-25 MED ORDER — PROPOFOL 10 MG/ML IV BOLUS
INTRAVENOUS | Status: DC | PRN
  Administered 2019-08-25: 80 mg via INTRAVENOUS
  Administered 2019-08-25: 200 mg via INTRAVENOUS

## 2019-08-25 MED ORDER — ONDANSETRON HCL 4 MG/2ML IJ SOLN
INTRAMUSCULAR | Status: DC | PRN
  Administered 2019-08-25: 4 mg via INTRAVENOUS

## 2019-08-25 MED ORDER — HYDROMORPHONE HCL 1 MG/ML IJ SOLN
0.2500 mg | INTRAMUSCULAR | Status: DC | PRN
  Administered 2019-08-25 (×2): 0.25 mg via INTRAVENOUS
  Filled 2019-08-25: qty 0.5

## 2019-08-25 MED ORDER — ACETIC ACID 5 % SOLN
Status: AC
  Filled 2019-08-25: qty 500

## 2019-08-25 MED ORDER — SCOPOLAMINE 1 MG/3DAYS TD PT72
MEDICATED_PATCH | TRANSDERMAL | Status: AC
  Filled 2019-08-25: qty 1

## 2019-08-25 MED ORDER — EPHEDRINE SULFATE 50 MG/ML IJ SOLN
INTRAMUSCULAR | Status: DC | PRN
  Administered 2019-08-25: 20 mg via INTRAVENOUS

## 2019-08-25 MED ORDER — MENTHOL 3 MG MT LOZG
1.0000 | LOZENGE | OROMUCOSAL | Status: DC | PRN
  Filled 2019-08-25: qty 9

## 2019-08-25 MED ORDER — HYDROMORPHONE HCL 1 MG/ML IJ SOLN
1.0000 mg | INTRAMUSCULAR | Status: DC | PRN
  Filled 2019-08-25: qty 1

## 2019-08-25 MED ORDER — OXYCODONE HCL 5 MG PO TABS
5.0000 mg | ORAL_TABLET | ORAL | Status: DC | PRN
  Administered 2019-08-25 (×2): 5 mg via ORAL
  Filled 2019-08-25: qty 2

## 2019-08-25 MED ORDER — IODINE STRONG (LUGOLS) 5 % PO SOLN
ORAL | Status: AC
  Filled 2019-08-25: qty 1

## 2019-08-25 MED ORDER — SCOPOLAMINE 1 MG/3DAYS TD PT72
1.0000 | MEDICATED_PATCH | TRANSDERMAL | Status: DC
  Administered 2019-08-25: 1.5 mg via TRANSDERMAL
  Filled 2019-08-25: qty 1

## 2019-08-25 MED ORDER — SUCCINYLCHOLINE CHLORIDE 20 MG/ML IJ SOLN
INTRAMUSCULAR | Status: DC | PRN
  Administered 2019-08-25: 140 mg via INTRAVENOUS

## 2019-08-25 MED ORDER — SUGAMMADEX SODIUM 200 MG/2ML IV SOLN
INTRAVENOUS | Status: DC | PRN
  Administered 2019-08-25: 225 mg via INTRAVENOUS

## 2019-08-25 MED ORDER — SUCCINYLCHOLINE CHLORIDE 200 MG/10ML IV SOSY
PREFILLED_SYRINGE | INTRAVENOUS | Status: AC
  Filled 2019-08-25: qty 10

## 2019-08-25 MED ORDER — BISACODYL 10 MG RE SUPP
10.0000 mg | Freq: Every day | RECTAL | Status: DC | PRN
  Filled 2019-08-25: qty 1

## 2019-08-25 MED ORDER — GABAPENTIN 300 MG PO CAPS
300.0000 mg | ORAL_CAPSULE | ORAL | Status: AC
  Administered 2019-08-25: 300 mg via ORAL
  Filled 2019-08-25: qty 1

## 2019-08-25 MED ORDER — ROCURONIUM BROMIDE 100 MG/10ML IV SOLN
INTRAVENOUS | Status: DC | PRN
  Administered 2019-08-25: 20 mg via INTRAVENOUS
  Administered 2019-08-25: 50 mg via INTRAVENOUS
  Administered 2019-08-25: 10 mg via INTRAVENOUS
  Administered 2019-08-25: 20 mg via INTRAVENOUS

## 2019-08-25 MED ORDER — LIDOCAINE 2% (20 MG/ML) 5 ML SYRINGE
INTRAMUSCULAR | Status: AC
  Filled 2019-08-25: qty 5

## 2019-08-25 MED ORDER — ACETAMINOPHEN 500 MG PO TABS
1000.0000 mg | ORAL_TABLET | ORAL | Status: AC
  Administered 2019-08-25: 1000 mg via ORAL
  Filled 2019-08-25: qty 2

## 2019-08-25 MED ORDER — ONDANSETRON HCL 4 MG/2ML IJ SOLN
4.0000 mg | Freq: Four times a day (QID) | INTRAMUSCULAR | Status: DC | PRN
  Filled 2019-08-25: qty 2

## 2019-08-25 MED ORDER — LIDOCAINE HCL (CARDIAC) PF 100 MG/5ML IV SOSY
PREFILLED_SYRINGE | INTRAVENOUS | Status: DC | PRN
  Administered 2019-08-25: 100 mg via INTRAVENOUS

## 2019-08-25 MED ORDER — FENTANYL CITRATE (PF) 250 MCG/5ML IJ SOLN
INTRAMUSCULAR | Status: AC
  Filled 2019-08-25: qty 5

## 2019-08-25 MED ORDER — ALPRAZOLAM 0.5 MG PO TABS
0.5000 mg | ORAL_TABLET | Freq: Every day | ORAL | Status: DC
  Filled 2019-08-25: qty 1

## 2019-08-25 SURGICAL SUPPLY — 52 items
ADAPTER AIRWAY CO2 STRAIGHT (MISCELLANEOUS) ×4 IMPLANT
ADH SKN CLS APL DERMABOND .7 (GAUZE/BANDAGES/DRESSINGS) ×3
ADPR ARWY STRG ETCO2 DISP (MISCELLANEOUS) ×3
BARRIER ADHS 3X4 INTERCEED (GAUZE/BANDAGES/DRESSINGS) IMPLANT
BRR ADH 4X3 ABS CNTRL BYND (GAUZE/BANDAGES/DRESSINGS)
CABLE HIGH FREQUENCY MONO STRZ (ELECTRODE) IMPLANT
COVER BACK TABLE 60X90IN (DRAPES) IMPLANT
COVER MAYO STAND STRL (DRAPES) ×4 IMPLANT
COVER WAND RF STERILE (DRAPES) ×4 IMPLANT
DERMABOND ADVANCED (GAUZE/BANDAGES/DRESSINGS) ×1
DERMABOND ADVANCED .7 DNX12 (GAUZE/BANDAGES/DRESSINGS) ×3 IMPLANT
DEVICE SUTURE ENDOST 10MM (ENDOMECHANICALS) ×2 IMPLANT
DRSG OPSITE POSTOP 4X10 (GAUZE/BANDAGES/DRESSINGS) ×2 IMPLANT
DURAPREP 26ML APPLICATOR (WOUND CARE) ×4 IMPLANT
GLOVE BIOGEL PI IND STRL 8 (GLOVE) ×3 IMPLANT
GLOVE BIOGEL PI INDICATOR 8 (GLOVE) ×1
GLOVE ORTHO TXT STRL SZ7.5 (GLOVE) ×8 IMPLANT
GOWN STRL REUS W/TWL LRG LVL3 (GOWN DISPOSABLE) ×4 IMPLANT
GOWN STRL REUS W/TWL XL LVL3 (GOWN DISPOSABLE) ×8 IMPLANT
NDL SPNL 22GX3.5 QUINCKE BK (NEEDLE) ×2 IMPLANT
NEEDLE INSUFFLATION 120MM (ENDOMECHANICALS) ×4 IMPLANT
NEEDLE SPNL 22GX3.5 QUINCKE BK (NEEDLE) ×4 IMPLANT
NS IRRIG 1000ML POUR BTL (IV SOLUTION) ×4 IMPLANT
OCCLUDER COLPOPNEUMO (BALLOONS) ×4 IMPLANT
PACK LAPAROSCOPY BASIN (CUSTOM PROCEDURE TRAY) ×4 IMPLANT
PACK TRENDGUARD 450 HYBRID PRO (MISCELLANEOUS) ×1 IMPLANT
PROTECTOR NERVE ULNAR (MISCELLANEOUS) ×8 IMPLANT
SCISSORS LAP 5X35 DISP (ENDOMECHANICALS) IMPLANT
SET IRRIG TUBING LAPAROSCOPIC (IRRIGATION / IRRIGATOR) ×4 IMPLANT
SET IRRIG Y TYPE TUR BLADDER L (SET/KITS/TRAYS/PACK) IMPLANT
SET TRI-LUMEN FLTR TB AIRSEAL (TUBING) ×4 IMPLANT
SHEARS HARMONIC ACE PLUS 36CM (ENDOMECHANICALS) ×2 IMPLANT
SUT ENDO VLOC 180-0-8IN (SUTURE) ×2 IMPLANT
SUT VIC AB 0 CT1 36 (SUTURE) IMPLANT
SUT VIC AB 4-0 PS2 18 (SUTURE) ×2 IMPLANT
SUT VIC AB 4-0 PS2 27 (SUTURE) ×4 IMPLANT
SUT VICRYL 0 UR6 27IN ABS (SUTURE) ×8 IMPLANT
SYR 10ML LL (SYRINGE) ×4 IMPLANT
SYR 50ML LL SCALE MARK (SYRINGE) ×4 IMPLANT
SYR CONTROL 10ML LL (SYRINGE) ×4 IMPLANT
SYSTEM CARTER THOMASON II (TROCAR) ×2 IMPLANT
TIP UTERINE 5.1X6CM LAV DISP (MISCELLANEOUS) IMPLANT
TIP UTERINE 6.7X10CM GRN DISP (MISCELLANEOUS) IMPLANT
TIP UTERINE 6.7X6CM WHT DISP (MISCELLANEOUS) IMPLANT
TIP UTERINE 6.7X8CM BLUE DISP (MISCELLANEOUS) ×2 IMPLANT
TOWEL OR 17X26 10 PK STRL BLUE (TOWEL DISPOSABLE) ×8 IMPLANT
TRAY FOLEY W/BAG SLVR 14FR (SET/KITS/TRAYS/PACK) ×4 IMPLANT
TRENDGUARD 450 HYBRID PRO PACK (MISCELLANEOUS) ×4
TROCAR BLADELESS OPT 5 100 (ENDOMECHANICALS) ×4 IMPLANT
TROCAR PORT AIRSEAL 5X120 (TROCAR) ×4 IMPLANT
TROCAR XCEL DIL TIP R 11M (ENDOMECHANICALS) ×4 IMPLANT
WARMER LAPAROSCOPE (MISCELLANEOUS) ×4 IMPLANT

## 2019-08-25 NOTE — Progress Notes (Signed)
Post-op check, TLH Doing well, pain os tolerable, tol PO-no n/v, has ambulated and voided, wants to go home Afeb, VSS Abd- soft, incisions intact D/c home

## 2019-08-25 NOTE — Anesthesia Postprocedure Evaluation (Signed)
Anesthesia Post Note  Patient: NILANI HUGILL  Procedure(s) Performed: TOTAL LAPAROSCOPIC HYSTERECTOMY WITH BILATERAL SALPINGECTOMY (Bilateral Abdomen) CONDYLOMA REMOVAL (N/A Vagina )     Patient location during evaluation: PACU Anesthesia Type: General Level of consciousness: awake and alert and oriented Pain management: pain level controlled Vital Signs Assessment: post-procedure vital signs reviewed and stable Respiratory status: spontaneous breathing, nonlabored ventilation and respiratory function stable Cardiovascular status: blood pressure returned to baseline and stable Postop Assessment: no apparent nausea or vomiting Anesthetic complications: no    Last Vitals:  Vitals:   08/25/19 1045 08/25/19 1054  BP: 123/75 132/73  Pulse: 81 87  Resp: 12 16  Temp:  36.4 C  SpO2: 92% 97%    Last Pain:  Vitals:   08/25/19 1054  TempSrc:   PainSc: 5                  Rileyann Florance A.

## 2019-08-25 NOTE — Transfer of Care (Signed)
Immediate Anesthesia Transfer of Care Note  Patient: Deborah Francis  Procedure(s) Performed: Procedure(s) (LRB): TOTAL LAPAROSCOPIC HYSTERECTOMY WITH BILATERAL SALPINGECTOMY (Bilateral) CONDYLOMA REMOVAL (N/A)  Patient Location: PACU  Anesthesia Type: General  Level of Consciousness: awake, sedated, patient cooperative and responds to stimulation  Airway & Oxygen Therapy: Patient Spontanous Breathing and Patient connected to [place] 02 and soft FM   Post-op Assessment: Report given to PACU RN, Post -op Vital signs reviewed and stable and Patient moving all extremities  Post vital signs: Reviewed and stable  Complications: No apparent anesthesia complications

## 2019-08-25 NOTE — Discharge Instructions (Signed)
Routine instructions for laparoscopic hysterectomy 

## 2019-08-25 NOTE — Interval H&P Note (Signed)
History and Physical Interval Note:  08/25/2019 7:13 AM  Deborah Francis  has presented today for surgery, with the diagnosis of pain in pelvis, abnormal uterine bleeding.  The various methods of treatment have been discussed with the patient and family. After consideration of risks, benefits and other options for treatment, the patient has consented to  Procedure(s) with comments: TOTAL LAPAROSCOPIC HYSTERECTOMY WITH SALPINGECTOMY (Bilateral) COLPOSCOPY (N/A) - possible-actually cystoscopy, not colposcopy, and removal of condyloma, as a surgical intervention.  The patient's history has been reviewed, patient examined, no change in status, stable for surgery.  I have reviewed the patient's chart and labs.  Questions were answered to the patient's satisfaction.     Leighton Roach Corey Caulfield

## 2019-08-25 NOTE — Op Note (Signed)
Preoperative diagnosis: Abnormal uterine bleeding, pelvic pain, vulvar condyloma Postoperative diagnosis: Same  Procedure: Total laparoscopic hysterectomy, bilateral salpingectomy, removal of vulvar condyloma Surgeon: Lavina Hamman M.D.  Assistant: Pryor Ochoa, D.O. Anesthesia: Gen. Endotracheal tube  Findings: She had an essentially normal pelvis, cystic structure medial to left ovary, 2 condyloma at 1:00.  Assistance was necessary from Dr. Mindi Slicker to help retract and take down pedicles on her side Estimated blood loss: 200 cc  Specimens:  Uterus, tubes, 2 condyloma Complications: None  Procedure in detail:   The patient was taken to the operating room and placed in the dorsosupine position. General anesthesia was induced. Arms were tucked to her sides and legs were placed in mobile stirrups. Abdomen perineum and vagina were then prepped and draped in usual sterile fashion. A 34mm RUMI with the small sized metal cup was then applied to the uterus and cervix for uterine manipulation and a Foley catheter was inserted. Infraumbilical skin was infiltrated with quarter percent Marcaine and a 1 cm vertical incision was made. A veress needle was inserted into the peritoneal cavity and placement confirmed by the water drop test an opening pressure of 3 mm of mercury. CO2 was insufflated to a pressure of 12 mm mercury and the veress needle was removed. A 5 mm trocar was then introduced with direct visualization with the laparoscope. A 5 mm air seal port was then also placed on the right side and a 10/11 trocar placed on the left side under direct visualization. Inspection revealed the above-mentioned findings. The distal right fallopian tube was grasped with a grasper from the left side. The Harmonic scalpel Ace was used to take down the right mesosalpinx. The round ligament, utero-ovarian pedicle and broad ligament were then also taken down. The anterior peritoneum was incised across the anterior portion of the  uterus to help release the bladder. Uterine artery artery was skeletonized and taken down with the harmonic scalpel Ace with adequate division and adequate hemostasis. A similar procedure was then performed on the patient's left side taking down the mesosalpinx and mobilizing the tube, I was also able to free the cystic structure and keep it attached to the tube, then the round ligament, utero-ovarian pedicle, and broad ligament. Anterior peritoneum was incised across the anterior portion the uterus to meet the incision coming from the patient's right side, taking down some adhesions along the way. Uterine artery was skeletonized and taken down with the Harmonic Scalpel with adequate division and adequate hemostasis. I then began to detach the cervix from the vagina. The RUMI cup was pushed superiorly, the Harmonic scalpel was placed on the cup edge and a circumferential incision was made starting anteriorly into the vagina. This released the uterosacral ligaments as well as all attachments to the vagina. At this point all pedicles appeared to be hemostatic and there was no other pathology noted.  The uterus was pulled into the vagina with some difficulty.  The pelvis was irrigated and found to be hemostatic.  The vaginal cuff was then closed with the Endostitch device using 0 V-lok suture.  The pelvis was again irrigated and found to be hemostatic with good closure of the vaginal cuff.  Ureters appeared to be lateral from any incision lines. The lateral ports were removed under direct visualization and all gas was allowed to deflate from the abdomen. The incision on the left was closed with a Carter-Thomason closure due to some bleeding, also several deep sutures of 0 Vicryl.  The umbilical trocar was  then removed. Skin incisions were closed with interrupted subcuticular sutures of 4-0 Vicryl followed by Dermabond. The uterus was then removed from the vagina.  The 2 condyloma were removed sharply, bleeding  controlled with one figure 8 suture of 4-0 Vicryl. The patient tolerated the procedure well. Foley catheter was removed.  She was taken to the recovery in stable condition. Counts were correct x2, she received Anmcef 3 gm IV at the beginning of the procedure and had PAS hose on throughout the procedure.

## 2019-08-25 NOTE — Anesthesia Procedure Notes (Signed)
Procedure Name: Intubation Date/Time: 08/25/2019 7:46 AM Performed by: Justice Rocher, CRNA Pre-anesthesia Checklist: Patient identified, Emergency Drugs available, Suction available and Patient being monitored Patient Re-evaluated:Patient Re-evaluated prior to induction Oxygen Delivery Method: Circle system utilized Preoxygenation: Pre-oxygenation with 100% oxygen Induction Type: IV induction Ventilation: Mask ventilation without difficulty Laryngoscope Size: Mac and 4 Grade View: Grade II Tube type: Oral Tube size: 7.5 mm Number of attempts: 1 Airway Equipment and Method: Stylet and Oral airway Placement Confirmation: ETT inserted through vocal cords under direct vision,  positive ETCO2,  breath sounds checked- equal and bilateral and CO2 detector Secured at: 23 cm Tube secured with: Tape Dental Injury: Teeth and Oropharynx as per pre-operative assessment  Comments: Face dry and chapped. Lips dry. Teeth poor condition preop

## 2019-08-29 NOTE — Discharge Summary (Signed)
Physician Discharge Summary  Patient ID: Deborah Francis MRN: [id number] DOB/AGE: [birth year] 39 y.o.  Admit date: 08/25/2019 Discharge date: 08/25/2019  Admission Diagnoses:  AUB, pelvic pain, vulvar condyloma  Discharge Diagnoses: Same Active Problems:   Abnormal uterine bleeding   S/P laparoscopic hysterectomy   Discharged Condition: good  Hospital Course: Pt underwent TLH, bilateral salpingectomy and removal of vulvar condyloma without complications.  By the evening of surgery she was tolerating PO, ambulating, voiding, had adequate pain control, stable for discharge home   Discharge Exam: Blood pressure 121/70, pulse 66, temperature 99.2 F (37.3 C), resp. rate 16, height '5\' 8"'$  (1.727 m), weight (!) 141.2 kg, SpO2 100 %. General appearance: alert  Disposition: Discharge disposition: 01-Home or Self Care       Discharge Instructions    Call MD for:  difficulty breathing, headache or visual disturbances   Complete by: As directed    Call MD for:  extreme fatigue   Complete by: As directed    Call MD for:  persistant dizziness or light-headedness   Complete by: As directed    Call MD for:  persistant nausea and vomiting   Complete by: As directed    Call MD for:  severe uncontrolled pain   Complete by: As directed    Call MD for:  temperature >100.4   Complete by: As directed    Diet - low sodium heart healthy   Complete by: As directed    Increase activity slowly   Complete by: As directed    Lifting restrictions   Complete by: As directed    10 lbs   Sexual Activity Restrictions   Complete by: As directed    Pelvic rest     Allergies as of 08/25/2019   No Known Allergies     Medication List    STOP taking these medications   norethindrone 5 MG tablet Commonly known as: AYGESTIN     TAKE these medications   acetaminophen 325 MG tablet Commonly known as: TYLENOL Take 650 mg by mouth every 6 (six) hours as needed (for pain.). Notes to patient: Take next  dose at 6:30 pm   ALPRAZolam 0.5 MG tablet Commonly known as: XANAX Take 1 tablet (0.5 mg total) by mouth daily as needed for anxiety. What changed: when to take this   amitriptyline 25 MG tablet Commonly known as: ELAVIL Take 25 mg by mouth at bedtime.   BARIATRIC MULTIVITAMINS/IRON PO Take 1 tablet by mouth daily.   CALCIUM GUMMIES PO Take 1 tablet by mouth in the morning and at bedtime. Midafternoon & Late evening.   clonazePAM 0.5 MG tablet Commonly known as: KLONOPIN Take 0.5 mg by mouth in the morning and at bedtime.   docusate sodium 100 MG capsule Commonly known as: COLACE Take 200 mg by mouth daily.   glucose blood test strip Commonly known as: ONE TOUCH TEST STRIPS 1 each by Other route 2 (two) times daily. Use as instructed   ONE TOUCH ULTRA 2 w/Device Kit 1 Device by Does not apply route one time only at 6 PM.   onetouch ultrasoft lancets 1 each by Other route 2 (two) times daily. Use as instructed   oxyCODONE 5 MG immediate release tablet Commonly known as: Oxy IR/ROXICODONE Take 1 tablet (5 mg total) by mouth every 4 (four) hours as needed for severe pain. Notes to patient: Take next dose at 8:00 pm   pantoprazole 40 MG tablet Commonly known as: PROTONIX TAKE 1 TABLET BY  MOUTH DAILY   pregabalin 150 MG capsule Commonly known as: LYRICA Take 150 mg by mouth in the morning and at bedtime.   sertraline 100 MG tablet Commonly known as: ZOLOFT Take 150 mg by mouth daily.   topiramate 25 MG tablet Commonly known as: TOPAMAX Take 1 tablet (25 mg total) by mouth daily. MUST SCHEDULE ANNUAL PHYSICAL What changed:   how much to take  when to take this  additional instructions      Follow-up Information    Marvelle Caudill, MD. Schedule an appointment as soon as possible for a visit in 2 week(s).   Specialty: Obstetrics and Gynecology Contact information: [address], [place] [address] [place] [address] [phone]            Signed: Blane Ohara Stephanieann Popescu 08/29/2019, 3:25 PM

## 2019-08-30 LAB — SURGICAL PATHOLOGY

## 2021-07-04 ENCOUNTER — Emergency Department
Admission: EM | Admit: 2021-07-04 | Discharge: 2021-07-04 | Disposition: A | Discharge status: Home / Self Care | Payer: Medicaid Other | Attending: Emergency Medicine | Admitting: Emergency Medicine

## 2021-07-04 ENCOUNTER — Encounter: Payer: Self-pay | Admitting: Emergency Medicine

## 2021-07-04 ENCOUNTER — Other Ambulatory Visit: Payer: Self-pay

## 2021-07-04 ENCOUNTER — Emergency Department: Payer: Medicaid Other

## 2021-07-04 DIAGNOSIS — S4992XA Unspecified injury of left shoulder and upper arm, initial encounter: Secondary | ICD-10-CM | POA: Diagnosis present

## 2021-07-04 DIAGNOSIS — E1140 Type 2 diabetes mellitus with diabetic neuropathy, unspecified: Secondary | ICD-10-CM | POA: Insufficient documentation

## 2021-07-04 DIAGNOSIS — W010XXA Fall on same level from slipping, tripping and stumbling without subsequent striking against object, initial encounter: Secondary | ICD-10-CM | POA: Insufficient documentation

## 2021-07-04 DIAGNOSIS — S40022A Contusion of left upper arm, initial encounter: Secondary | ICD-10-CM | POA: Diagnosis not present

## 2021-07-04 DIAGNOSIS — Y92002 Bathroom of unspecified non-institutional (private) residence single-family (private) house as the place of occurrence of the external cause: Secondary | ICD-10-CM | POA: Diagnosis not present

## 2021-07-04 DIAGNOSIS — W19XXXA Unspecified fall, initial encounter: Secondary | ICD-10-CM

## 2021-07-04 MED ORDER — IBUPROFEN 800 MG PO TABS
800.0000 mg | ORAL_TABLET | Freq: Three times a day (TID) | ORAL | 0 refills | Status: AC | PRN
Start: 2021-07-04 — End: ?

## 2021-07-04 MED ORDER — ACETAMINOPHEN 500 MG PO TABS
1000.0000 mg | ORAL_TABLET | Freq: Once | ORAL | Status: AC
  Administered 2021-07-04: 1000 mg via ORAL
  Filled 2021-07-04: qty 2

## 2021-07-04 MED ORDER — TRAMADOL HCL 50 MG PO TABS
50.0000 mg | ORAL_TABLET | Freq: Once | ORAL | Status: AC
  Administered 2021-07-04: 50 mg via ORAL
  Filled 2021-07-04: qty 1

## 2021-07-04 NOTE — ED Triage Notes (Signed)
Patient ambulatory to triage with steady gait, without difficulty or distress noted; pt reports fell getting up OOB to BR; c/o pain to left upper arm

## 2021-07-04 NOTE — ED Provider Notes (Signed)
[facility] Provider Note    Event Date/Time   First MD Initiated Contact with Patient 07/04/21 [phone]     (approximate)   History   Arm Injury   HPI  Deborah Francis is a 41 y.o. female with history as listed below who presents for evaluation of left shoulder and arm pain.  Patient reports that she got up in the middle of the night to go to the bath when she lost her balance and fell onto the left arm.  She is complaining of severe constant sharp pain that is worse with movement of the shoulder.  She denies elbow pain or wrist pain, neck pain or headache, no head trauma or LOC.  She is not on blood thinners.     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   TONSILLECTOMY     TOTAL LAPAROSCOPIC HYSTERECTOMY WITH SALPINGECTOMY Bilateral 08/25/2019   Procedure: TOTAL LAPAROSCOPIC HYSTERECTOMY WITH BILATERAL SALPINGECTOMY;  Surgeon: Lavina Hamman, MD;  Location: [facility];  Service: Gynecology;  Laterality: Bilateral;     Physical Exam   Triage Vital Signs: ED Triage Vitals  Enc Vitals Group     BP 07/04/21 0435 107/65     Pulse Rate 07/04/21 0435 (!) 58     Resp 07/04/21 0435 20     Temp 07/04/21 0435 97.6 F (36.4 C)     Temp Source 07/04/21 0435 Oral     SpO2 07/04/21 0435 99 %     Weight 07/04/21 0433 300 lb (136.1 kg)     Height 07/04/21 0433 5\' 8"  (1.727 m)     Head Circumference --      Peak Flow --      Pain Score  07/04/21 0433 10     Pain Loc --      Pain Edu? --      Excl. in GC? --     Most recent vital signs: Vitals:   07/04/21 0435 07/04/21 0623  BP: 107/65 110/65  Pulse: (!) 58 60  Resp: 20 16  Temp: 97.6 F (36.4 C)   SpO2: 99% 99%    Constitutional: Alert and oriented. No acute distress. Does not appear intoxicated. HEENT Head: Normocephalic and atraumatic. Face: No facial bony tenderness. Stable midface Ears: No hemotympanum bilaterally. No Battle sign Eyes: No eye injury. PERRL. No raccoon eyes Nose: Nontender. No epistaxis. No rhinorrhea Mouth/Throat: Mucous membranes are moist. No oropharyngeal blood. No dental injury. Airway patent without stridor. Normal voice. Neck: no C-collar. No midline c-spine tenderness.  Cardiovascular: Normal rate, regular rhythm. Normal and symmetric distal pulses are present in all extremities. Pulmonary/Chest: Chest wall is stable and nontender to palpation/compression. Normal respiratory effort. Breath sounds are normal. No crepitus.  Abdominal: Soft, nontender, non distended. Musculoskeletal: Nontender with normal full range of motion in wrist and elbow on the L, somewhat limited ROM of the shoulder due to pain, no  bony deformities or bruising. Normal ROM in all other extremities. No deformities. No thoracic or lumbar midline spinal tenderness. Pelvis is stable. Skin: Skin is warm, dry and intact. No abrasions or contutions. Psychiatric: Speech and behavior are appropriate. Neurological: Normal speech and language. Moves all extremities to command. No gross focal neurologic deficits are appreciated.  Glascow Coma Score: 4 - Opens eyes on own 6 - Follows simple motor commands 5 - Alert and oriented GCS: 15   ED Results / Procedures / Treatments   Labs (all labs ordered are listed, but only abnormal results are displayed) Labs Reviewed - No data to display   EKG  none   RADIOLOGY I, Nita Sickle, attending MD, have personally  viewed and interpreted the images obtained during this visit as below:  X-rays with no acute fracture or dislocation   ___________________________________________________ Interpretation by Radiologist:  DG Shoulder Left  Result Date: 07/04/2021 CLINICAL DATA:  41 year old female with history of fall complaining of left shoulder pain. EXAM: LEFT SHOULDER - 2+ VIEW COMPARISON:  No priors. FINDINGS: There is no evidence of fracture or dislocation. There is no evidence of arthropathy or other focal bone abnormality. Soft tissues are unremarkable. IMPRESSION: Negative. Electronically Signed   By: Trudie Reed M.D.   On: 07/04/2021 05:56   DG Humerus Left  Result Date: 07/04/2021 CLINICAL DATA:  41 year old female with history of trauma from a fall complaining of left upper arm pain. EXAM: LEFT HUMERUS - 2+ VIEW COMPARISON:  Left elbow radiograph 08/29/2020. FINDINGS: There is no evidence of fracture or other focal bone lesions. Soft tissues are unremarkable. IMPRESSION: Negative. Electronically Signed   By: Trudie Reed M.D.   On: 07/04/2021 05:03      PROCEDURES:  Critical Care performed: No  Procedures    IMPRESSION / MDM / ASSESSMENT AND PLAN / ED COURSE  I reviewed the triage vital signs and the nursing notes.  41 y.o. female with history as listed below who presents for evaluation of left shoulder and arm pain after losing her balance and falling onto the left arm this morning.  Patient has somewhat limited range of motion of the left shoulder due to pain but no obvious bruising or deformity.  Remaining of the left upper extremity is atraumatic.  She has no midline spine tenderness and head is atraumatic.  Ddx: Contusion versus fracture versus dislocation   Plan: X-ray of the humerus and shoulder.  Will give tramadol and Tylenol for pain   MEDICATIONS GIVEN IN ED: Medications  traMADol (ULTRAM) tablet 50 mg (50 mg Oral Given 07/04/21 0614)  acetaminophen (TYLENOL) tablet  1,000 mg (1,000 mg Oral Given 07/04/21 1191)     ED COURSE: X-rays with no acute fracture or dislocation.  Patient was placed on the sling and discharged home with supportive care.  Recommended follow-up with Ortho if she is not fully back to normal in a week.  Discussed my standard return precautions.   Consults: None   EMR reviewed including patient's last visit to her primary care doctor for her migraine headaches from November 2020          FINAL CLINICAL IMPRESSION(S) / ED DIAGNOSES   Final diagnoses:  Arm contusion, left, initial encounter  Fall, initial encounter     Rx / DC Orders   ED Discharge Orders          Ordered    ibuprofen (ADVIL) 800 MG tablet  Every 8 hours PRN  07/04/21 0615             Note:  This document was prepared using Dragon voice recognition software and may include unintentional dictation errors.   Don PerkingVeronese, [place], MD 07/04/21 [phone]
